# Patient Record
Sex: Male | Born: 1935 | Race: White | Hispanic: No | Marital: Married | Smoking: Former smoker
Health system: Southern US, Community
[De-identification: ages and names within clinical notes are randomized; demographics above are authoritative.]

## PROBLEM LIST (undated history)

## (undated) DIAGNOSIS — R809 Proteinuria, unspecified: Secondary | ICD-10-CM

## (undated) DIAGNOSIS — K5790 Diverticulosis of intestine, part unspecified, without perforation or abscess without bleeding: Secondary | ICD-10-CM

## (undated) DIAGNOSIS — I2510 Atherosclerotic heart disease of native coronary artery without angina pectoris: Secondary | ICD-10-CM

## (undated) DIAGNOSIS — N400 Enlarged prostate without lower urinary tract symptoms: Secondary | ICD-10-CM

## (undated) DIAGNOSIS — E785 Hyperlipidemia, unspecified: Secondary | ICD-10-CM

## (undated) DIAGNOSIS — I10 Essential (primary) hypertension: Secondary | ICD-10-CM

## (undated) DIAGNOSIS — E0590 Thyrotoxicosis, unspecified without thyrotoxic crisis or storm: Secondary | ICD-10-CM

## (undated) DIAGNOSIS — I872 Venous insufficiency (chronic) (peripheral): Secondary | ICD-10-CM

## (undated) DIAGNOSIS — G4700 Insomnia, unspecified: Secondary | ICD-10-CM

## (undated) DIAGNOSIS — D721 Eosinophilia: Secondary | ICD-10-CM

## (undated) DIAGNOSIS — N189 Chronic kidney disease, unspecified: Secondary | ICD-10-CM

## (undated) HISTORY — DX: Thyrotoxicosis, unspecified without thyrotoxic crisis or storm: E05.90

## (undated) HISTORY — DX: Hyperlipidemia, unspecified: E78.5

## (undated) HISTORY — DX: Proteinuria, unspecified: R80.9

## (undated) HISTORY — DX: Atherosclerotic heart disease of native coronary artery without angina pectoris: I25.10

## (undated) HISTORY — DX: Venous insufficiency (chronic) (peripheral): I87.2

## (undated) HISTORY — DX: Benign prostatic hyperplasia without lower urinary tract symptoms: N40.0

## (undated) HISTORY — DX: Essential (primary) hypertension: I10

## (undated) HISTORY — DX: Chronic kidney disease, unspecified: N18.9

## (undated) HISTORY — DX: Insomnia, unspecified: G47.00

## (undated) HISTORY — DX: Eosinophilia: D72.1

## (undated) HISTORY — DX: Diverticulosis of intestine, part unspecified, without perforation or abscess without bleeding: K57.90

## (undated) HISTORY — PX: CORONARY ARTERY BYPASS GRAFT: SHX141

---

## 1998-05-29 ENCOUNTER — Encounter: Admission: RE | Admit: 1998-05-29 | Discharge: 1998-05-29 | Payer: Self-pay | Admitting: Internal Medicine

## 1999-01-04 ENCOUNTER — Encounter: Admission: RE | Admit: 1999-01-04 | Discharge: 1999-01-04 | Payer: Self-pay | Admitting: Internal Medicine

## 1999-07-14 ENCOUNTER — Encounter: Admission: RE | Admit: 1999-07-14 | Discharge: 1999-07-14 | Payer: Self-pay | Admitting: Internal Medicine

## 1999-07-28 ENCOUNTER — Encounter: Admission: RE | Admit: 1999-07-28 | Discharge: 1999-07-28 | Payer: Self-pay | Admitting: Internal Medicine

## 1999-10-05 ENCOUNTER — Encounter: Admission: RE | Admit: 1999-10-05 | Discharge: 1999-10-05 | Payer: Self-pay | Admitting: Internal Medicine

## 2000-02-02 ENCOUNTER — Encounter: Admission: RE | Admit: 2000-02-02 | Discharge: 2000-02-02 | Payer: Self-pay | Admitting: Internal Medicine

## 2000-03-24 ENCOUNTER — Encounter: Admission: RE | Admit: 2000-03-24 | Discharge: 2000-04-14 | Payer: Self-pay | Admitting: Neurology

## 2000-09-05 ENCOUNTER — Encounter: Admission: RE | Admit: 2000-09-05 | Discharge: 2000-09-05 | Payer: Self-pay | Admitting: Internal Medicine

## 2000-09-13 ENCOUNTER — Encounter: Admission: RE | Admit: 2000-09-13 | Discharge: 2000-09-13 | Payer: Self-pay | Admitting: Hematology and Oncology

## 2000-09-28 ENCOUNTER — Encounter: Admission: RE | Admit: 2000-09-28 | Discharge: 2000-09-28 | Payer: Self-pay | Admitting: Internal Medicine

## 2000-10-04 ENCOUNTER — Encounter: Admission: RE | Admit: 2000-10-04 | Discharge: 2000-10-04 | Payer: Self-pay | Admitting: Internal Medicine

## 2001-01-18 ENCOUNTER — Ambulatory Visit (HOSPITAL_COMMUNITY): Admission: RE | Admit: 2001-01-18 | Discharge: 2001-01-18 | Payer: Self-pay | Admitting: *Deleted

## 2001-01-19 ENCOUNTER — Ambulatory Visit (HOSPITAL_COMMUNITY): Admission: RE | Admit: 2001-01-19 | Discharge: 2001-01-19 | Payer: Self-pay | Admitting: *Deleted

## 2001-01-23 ENCOUNTER — Ambulatory Visit (HOSPITAL_COMMUNITY): Admission: RE | Admit: 2001-01-23 | Discharge: 2001-01-23 | Payer: Self-pay | Admitting: *Deleted

## 2001-01-23 ENCOUNTER — Encounter: Payer: Self-pay | Admitting: *Deleted

## 2001-08-28 ENCOUNTER — Encounter: Admission: RE | Admit: 2001-08-28 | Discharge: 2001-08-28 | Payer: Self-pay | Admitting: Internal Medicine

## 2002-03-02 ENCOUNTER — Emergency Department (HOSPITAL_COMMUNITY): Admission: EM | Admit: 2002-03-02 | Discharge: 2002-03-02 | Payer: Self-pay | Admitting: Emergency Medicine

## 2002-05-01 ENCOUNTER — Encounter: Admission: RE | Admit: 2002-05-01 | Discharge: 2002-05-01 | Payer: Self-pay | Admitting: Internal Medicine

## 2002-10-08 ENCOUNTER — Encounter: Admission: RE | Admit: 2002-10-08 | Discharge: 2002-10-08 | Payer: Self-pay | Admitting: Internal Medicine

## 2002-11-06 ENCOUNTER — Encounter: Admission: RE | Admit: 2002-11-06 | Discharge: 2002-11-06 | Payer: Self-pay | Admitting: Internal Medicine

## 2002-11-14 ENCOUNTER — Encounter: Admission: RE | Admit: 2002-11-14 | Discharge: 2002-11-14 | Payer: Self-pay | Admitting: Internal Medicine

## 2002-11-19 ENCOUNTER — Emergency Department (HOSPITAL_COMMUNITY): Admission: EM | Admit: 2002-11-19 | Discharge: 2002-11-19 | Payer: Self-pay | Admitting: Emergency Medicine

## 2002-12-05 ENCOUNTER — Encounter: Admission: RE | Admit: 2002-12-05 | Discharge: 2002-12-05 | Payer: Self-pay | Admitting: Internal Medicine

## 2003-07-15 ENCOUNTER — Ambulatory Visit (HOSPITAL_COMMUNITY): Admission: RE | Admit: 2003-07-15 | Discharge: 2003-07-15 | Payer: Self-pay | Admitting: Internal Medicine

## 2003-07-15 ENCOUNTER — Encounter: Admission: RE | Admit: 2003-07-15 | Discharge: 2003-07-15 | Payer: Self-pay | Admitting: Internal Medicine

## 2003-09-01 ENCOUNTER — Encounter: Payer: Self-pay | Admitting: Internal Medicine

## 2003-09-25 ENCOUNTER — Encounter: Admission: RE | Admit: 2003-09-25 | Discharge: 2003-09-25 | Payer: Self-pay | Admitting: Internal Medicine

## 2003-10-03 ENCOUNTER — Ambulatory Visit (HOSPITAL_COMMUNITY): Admission: RE | Admit: 2003-10-03 | Discharge: 2003-10-03 | Payer: Self-pay | Admitting: Cardiology

## 2003-10-10 ENCOUNTER — Emergency Department (HOSPITAL_COMMUNITY): Admission: EM | Admit: 2003-10-10 | Discharge: 2003-10-10 | Payer: Self-pay | Admitting: Emergency Medicine

## 2003-11-29 DIAGNOSIS — D721 Eosinophilia, unspecified: Secondary | ICD-10-CM

## 2003-11-29 DIAGNOSIS — K5790 Diverticulosis of intestine, part unspecified, without perforation or abscess without bleeding: Secondary | ICD-10-CM

## 2003-11-29 HISTORY — DX: Diverticulosis of intestine, part unspecified, without perforation or abscess without bleeding: K57.90

## 2003-11-29 HISTORY — DX: Eosinophilia, unspecified: D72.10

## 2004-01-27 ENCOUNTER — Encounter: Admission: RE | Admit: 2004-01-27 | Discharge: 2004-01-27 | Payer: Self-pay | Admitting: Internal Medicine

## 2004-02-05 ENCOUNTER — Encounter: Admission: RE | Admit: 2004-02-05 | Discharge: 2004-02-05 | Payer: Self-pay | Admitting: Internal Medicine

## 2004-03-10 ENCOUNTER — Emergency Department (HOSPITAL_COMMUNITY): Admission: EM | Admit: 2004-03-10 | Discharge: 2004-03-10 | Payer: Self-pay | Admitting: Emergency Medicine

## 2004-03-11 ENCOUNTER — Inpatient Hospital Stay (HOSPITAL_COMMUNITY): Admission: EM | Admit: 2004-03-11 | Discharge: 2004-03-15 | Payer: Self-pay | Admitting: Emergency Medicine

## 2004-03-24 ENCOUNTER — Encounter: Admission: RE | Admit: 2004-03-24 | Discharge: 2004-03-24 | Payer: Self-pay | Admitting: Internal Medicine

## 2004-04-22 ENCOUNTER — Encounter: Admission: RE | Admit: 2004-04-22 | Discharge: 2004-04-22 | Payer: Self-pay | Admitting: Internal Medicine

## 2004-05-12 ENCOUNTER — Ambulatory Visit (HOSPITAL_COMMUNITY): Admission: RE | Admit: 2004-05-12 | Discharge: 2004-05-12 | Payer: Self-pay | Admitting: Internal Medicine

## 2004-05-12 ENCOUNTER — Encounter: Admission: RE | Admit: 2004-05-12 | Discharge: 2004-05-12 | Payer: Self-pay | Admitting: Internal Medicine

## 2004-06-24 ENCOUNTER — Encounter: Admission: RE | Admit: 2004-06-24 | Discharge: 2004-06-24 | Payer: Self-pay | Admitting: Internal Medicine

## 2004-10-11 ENCOUNTER — Ambulatory Visit: Payer: Self-pay | Admitting: Internal Medicine

## 2004-12-22 ENCOUNTER — Ambulatory Visit: Payer: Self-pay | Admitting: Cardiology

## 2005-01-04 ENCOUNTER — Ambulatory Visit: Payer: Self-pay | Admitting: Cardiology

## 2005-01-04 ENCOUNTER — Ambulatory Visit: Payer: Self-pay

## 2005-03-07 ENCOUNTER — Ambulatory Visit: Payer: Self-pay | Admitting: Internal Medicine

## 2005-07-11 ENCOUNTER — Ambulatory Visit: Payer: Self-pay | Admitting: Internal Medicine

## 2005-09-26 ENCOUNTER — Ambulatory Visit: Payer: Self-pay | Admitting: Internal Medicine

## 2005-10-25 ENCOUNTER — Ambulatory Visit: Payer: Self-pay | Admitting: Internal Medicine

## 2005-11-01 ENCOUNTER — Ambulatory Visit: Payer: Self-pay | Admitting: Cardiology

## 2005-11-03 ENCOUNTER — Ambulatory Visit (HOSPITAL_COMMUNITY): Admission: RE | Admit: 2005-11-03 | Discharge: 2005-11-03 | Payer: Self-pay | Admitting: Internal Medicine

## 2005-11-09 ENCOUNTER — Ambulatory Visit (HOSPITAL_COMMUNITY): Admission: RE | Admit: 2005-11-09 | Discharge: 2005-11-09 | Payer: Self-pay | Admitting: Gastroenterology

## 2005-11-15 ENCOUNTER — Ambulatory Visit: Payer: Self-pay | Admitting: Cardiology

## 2005-11-28 DIAGNOSIS — R809 Proteinuria, unspecified: Secondary | ICD-10-CM

## 2005-11-28 HISTORY — DX: Proteinuria, unspecified: R80.9

## 2005-12-07 ENCOUNTER — Ambulatory Visit (HOSPITAL_COMMUNITY): Admission: RE | Admit: 2005-12-07 | Discharge: 2005-12-07 | Payer: Self-pay | Admitting: Gastroenterology

## 2005-12-20 ENCOUNTER — Ambulatory Visit: Payer: Self-pay

## 2006-01-11 ENCOUNTER — Ambulatory Visit (HOSPITAL_COMMUNITY): Admission: RE | Admit: 2006-01-11 | Discharge: 2006-01-11 | Payer: Self-pay | Admitting: Internal Medicine

## 2006-01-11 ENCOUNTER — Ambulatory Visit: Payer: Self-pay | Admitting: Internal Medicine

## 2006-04-03 ENCOUNTER — Ambulatory Visit: Payer: Self-pay | Admitting: Internal Medicine

## 2006-07-26 ENCOUNTER — Ambulatory Visit: Payer: Self-pay | Admitting: Internal Medicine

## 2006-08-28 ENCOUNTER — Ambulatory Visit: Payer: Self-pay | Admitting: Cardiology

## 2006-09-30 DIAGNOSIS — Z951 Presence of aortocoronary bypass graft: Secondary | ICD-10-CM

## 2006-09-30 DIAGNOSIS — R809 Proteinuria, unspecified: Secondary | ICD-10-CM

## 2006-09-30 DIAGNOSIS — I2510 Atherosclerotic heart disease of native coronary artery without angina pectoris: Secondary | ICD-10-CM | POA: Insufficient documentation

## 2006-09-30 DIAGNOSIS — K5730 Diverticulosis of large intestine without perforation or abscess without bleeding: Secondary | ICD-10-CM

## 2006-09-30 DIAGNOSIS — E113299 Type 2 diabetes mellitus with mild nonproliferative diabetic retinopathy without macular edema, unspecified eye: Secondary | ICD-10-CM

## 2006-09-30 DIAGNOSIS — I10 Essential (primary) hypertension: Secondary | ICD-10-CM | POA: Insufficient documentation

## 2006-09-30 DIAGNOSIS — Z8639 Personal history of other endocrine, nutritional and metabolic disease: Secondary | ICD-10-CM

## 2006-09-30 DIAGNOSIS — K8020 Calculus of gallbladder without cholecystitis without obstruction: Secondary | ICD-10-CM | POA: Insufficient documentation

## 2006-09-30 DIAGNOSIS — N184 Chronic kidney disease, stage 4 (severe): Secondary | ICD-10-CM

## 2006-09-30 DIAGNOSIS — D721 Eosinophilia: Secondary | ICD-10-CM

## 2006-11-13 DIAGNOSIS — N400 Enlarged prostate without lower urinary tract symptoms: Secondary | ICD-10-CM

## 2006-11-15 ENCOUNTER — Ambulatory Visit: Payer: Self-pay | Admitting: Internal Medicine

## 2006-11-15 LAB — CONVERTED CEMR LAB
Basophils Absolute: 0 10*3/uL (ref 0.0–0.1)
Basophils Relative: 1 % (ref 0–1)
HCT: 47 % (ref 41.0–49.0)
Hemoglobin: 15.3 g/dL (ref 13.9–16.8)
Lymphocytes Relative: 34 % (ref 15–43)
Lymphs Abs: 2.4 10*3/uL (ref 0.8–3.1)
MCV: 91.3 fL (ref 78.8–100.0)
Monocytes Absolute: 0.5 10*3/uL (ref 0.2–0.7)
Monocytes Relative: 7 % (ref 3–11)
Potassium: 4.2 meq/L (ref 3.5–5.3)
RBC: 5.15 M/uL (ref 4.20–5.50)
Sodium: 142 meq/L (ref 135–145)

## 2007-01-10 ENCOUNTER — Encounter: Payer: Self-pay | Admitting: Internal Medicine

## 2007-03-13 ENCOUNTER — Ambulatory Visit: Payer: Self-pay | Admitting: Internal Medicine

## 2007-03-26 ENCOUNTER — Ambulatory Visit: Payer: Self-pay | Admitting: Hospitalist

## 2007-07-25 ENCOUNTER — Ambulatory Visit: Payer: Self-pay | Admitting: Internal Medicine

## 2007-07-25 LAB — CONVERTED CEMR LAB: Blood Glucose, Fingerstick: 204

## 2007-08-31 ENCOUNTER — Ambulatory Visit: Payer: Self-pay | Admitting: Cardiology

## 2007-09-03 ENCOUNTER — Ambulatory Visit: Payer: Self-pay | Admitting: *Deleted

## 2007-09-03 ENCOUNTER — Ambulatory Visit: Payer: Self-pay | Admitting: Internal Medicine

## 2007-09-03 LAB — CONVERTED CEMR LAB

## 2007-09-04 LAB — CONVERTED CEMR LAB
ALT: 11 units/L (ref 0–53)
AST: 15 units/L (ref 0–37)
Alkaline Phosphatase: 90 units/L (ref 39–117)
BUN: 39 mg/dL — ABNORMAL HIGH (ref 6–23)
Calcium: 8.7 mg/dL (ref 8.4–10.5)
Chloride: 106 meq/L (ref 96–112)
Creatinine, Urine: 110.9 mg/dL
Microalb Creat Ratio: 489.7 mg/g — ABNORMAL HIGH (ref 0.0–30.0)
Microalb, Ur: 54.3 mg/dL — ABNORMAL HIGH (ref 0.00–1.89)
Sodium: 141 meq/L (ref 135–145)
Total CHOL/HDL Ratio: 2.8
Total Protein: 6.7 g/dL (ref 6.0–8.3)

## 2007-09-18 ENCOUNTER — Encounter: Payer: Self-pay | Admitting: Internal Medicine

## 2007-09-24 ENCOUNTER — Ambulatory Visit: Payer: Self-pay | Admitting: Infectious Diseases

## 2007-12-14 ENCOUNTER — Ambulatory Visit: Payer: Self-pay | Admitting: Internal Medicine

## 2007-12-14 LAB — CONVERTED CEMR LAB: Hgb A1c MFr Bld: 8.1 %

## 2008-02-18 ENCOUNTER — Ambulatory Visit: Payer: Self-pay | Admitting: Internal Medicine

## 2008-02-18 DIAGNOSIS — I872 Venous insufficiency (chronic) (peripheral): Secondary | ICD-10-CM

## 2008-02-18 LAB — CONVERTED CEMR LAB
AST: 17 units/L (ref 0–37)
Alkaline Phosphatase: 77 units/L (ref 39–117)
Basophils Relative: 1 % (ref 0–1)
CO2: 22 meq/L (ref 19–32)
Calcium: 8.9 mg/dL (ref 8.4–10.5)
Chloride: 109 meq/L (ref 96–112)
Creatinine, Ser: 1.51 mg/dL — ABNORMAL HIGH (ref 0.40–1.50)
Creatinine, Urine: 72.8 mg/dL
Glucose, Bld: 165 mg/dL — ABNORMAL HIGH (ref 70–99)
Ketones, ur: NEGATIVE mg/dL
Leukocytes, UA: NEGATIVE
Lymphocytes Relative: 28 % (ref 12–46)
MCV: 86.4 fL (ref 78.0–100.0)
Microalb, Ur: 256 mg/dL — ABNORMAL HIGH (ref 0.00–1.89)
Monocytes Absolute: 0.5 10*3/uL (ref 0.1–1.0)
Monocytes Relative: 8 % (ref 3–12)
Platelets: 212 10*3/uL (ref 150–400)
Protein, ur: >300 mg/dL — AB
RBC: 5.3 M/uL (ref 4.22–5.81)
TSH: 0.84 microintl units/mL (ref 0.350–5.50)
Urine Glucose: 100 mg/dL — AB
WBC: 6.7 10*3/uL (ref 4.0–10.5)
pH: 6.5 (ref 5.0–8.0)

## 2008-04-04 ENCOUNTER — Ambulatory Visit: Payer: Self-pay | Admitting: Internal Medicine

## 2008-04-04 LAB — CONVERTED CEMR LAB
BUN: 33 mg/dL — ABNORMAL HIGH (ref 6–23)
Calcium: 9.1 mg/dL (ref 8.4–10.5)
Glucose, Bld: 114 mg/dL — ABNORMAL HIGH (ref 70–99)
Potassium: 4.3 meq/L (ref 3.5–5.3)
Sodium: 142 meq/L (ref 135–145)

## 2008-04-23 ENCOUNTER — Telehealth (INDEPENDENT_AMBULATORY_CARE_PROVIDER_SITE_OTHER): Payer: Self-pay | Admitting: Pharmacy Technician

## 2008-05-16 ENCOUNTER — Ambulatory Visit: Payer: Self-pay | Admitting: Internal Medicine

## 2008-05-16 DIAGNOSIS — G4700 Insomnia, unspecified: Secondary | ICD-10-CM | POA: Insufficient documentation

## 2008-08-08 ENCOUNTER — Ambulatory Visit: Payer: Self-pay | Admitting: Internal Medicine

## 2008-08-08 LAB — CONVERTED CEMR LAB: Hgb A1c MFr Bld: 7.4 %

## 2008-08-12 ENCOUNTER — Ambulatory Visit: Payer: Self-pay | Admitting: Internal Medicine

## 2008-08-12 LAB — CONVERTED CEMR LAB
BUN: 37 mg/dL — ABNORMAL HIGH (ref 6–23)
CO2: 23 meq/L (ref 19–32)
Calcium: 8.9 mg/dL (ref 8.4–10.5)
Chloride: 105 meq/L (ref 96–112)
Cholesterol: 151 mg/dL (ref 0–200)
Creatinine, Ser: 1.74 mg/dL — ABNORMAL HIGH (ref 0.40–1.50)
Triglycerides: 181 mg/dL — ABNORMAL HIGH (ref <150)

## 2008-09-24 ENCOUNTER — Ambulatory Visit: Payer: Self-pay | Admitting: Cardiology

## 2008-09-24 LAB — CONVERTED CEMR LAB
Basophils Absolute: 0.2 10*3/uL — ABNORMAL HIGH (ref 0.0–0.1)
CO2: 28 meq/L (ref 19–32)
Chloride: 107 meq/L (ref 96–112)
GFR calc Af Amer: 54 mL/min
Glucose, Bld: 127 mg/dL — ABNORMAL HIGH (ref 70–99)
Lymphocytes Relative: 35.5 % (ref 12.0–46.0)
MCHC: 34.3 g/dL (ref 30.0–36.0)
MCV: 88.4 fL (ref 78.0–100.0)
Monocytes Absolute: 0.5 10*3/uL (ref 0.1–1.0)
Neutrophils Relative %: 38.7 % — ABNORMAL LOW (ref 43.0–77.0)
Platelets: 186 10*3/uL (ref 150–400)
Potassium: 5.2 meq/L — ABNORMAL HIGH (ref 3.5–5.1)
RBC: 4.67 M/uL (ref 4.22–5.81)

## 2008-10-30 ENCOUNTER — Encounter: Payer: Self-pay | Admitting: Internal Medicine

## 2009-01-20 ENCOUNTER — Ambulatory Visit: Payer: Self-pay | Admitting: Internal Medicine

## 2009-01-20 LAB — CONVERTED CEMR LAB
AST: 18 units/L (ref 0–37)
Alkaline Phosphatase: 82 units/L (ref 39–117)
CO2: 24 meq/L (ref 19–32)
Calcium: 9.2 mg/dL (ref 8.4–10.5)
Chloride: 104 meq/L (ref 96–112)
Glucose, Bld: 161 mg/dL — ABNORMAL HIGH (ref 70–99)
Potassium: 4.7 meq/L (ref 3.5–5.3)
Sodium: 142 meq/L (ref 135–145)
Total Bilirubin: 0.8 mg/dL (ref 0.3–1.2)

## 2009-05-08 ENCOUNTER — Ambulatory Visit: Payer: Self-pay | Admitting: Internal Medicine

## 2009-05-08 LAB — CONVERTED CEMR LAB
ALT: 10 units/L (ref 0–53)
AST: 17 units/L (ref 0–37)
Albumin: 3.6 g/dL (ref 3.5–5.2)
Blood Glucose, AC Bkfst: 165 mg/dL
Chloride: 107 meq/L (ref 96–112)
Free T4: 0.94 ng/dL (ref 0.80–1.80)
GFR calc Af Amer: 48 mL/min — ABNORMAL LOW (ref >60)
GFR calc non Af Amer: 40 mL/min — ABNORMAL LOW (ref >60)
Hgb A1c MFr Bld: 7.7 %
Potassium: 4.4 meq/L (ref 3.5–5.3)
Sodium: 141 meq/L (ref 135–145)
Total Protein: 6.7 g/dL (ref 6.0–8.3)

## 2009-09-22 ENCOUNTER — Ambulatory Visit: Payer: Self-pay | Admitting: Internal Medicine

## 2009-09-24 ENCOUNTER — Ambulatory Visit: Payer: Self-pay | Admitting: Cardiology

## 2010-01-07 ENCOUNTER — Encounter: Payer: Self-pay | Admitting: Internal Medicine

## 2010-01-12 ENCOUNTER — Telehealth: Payer: Self-pay | Admitting: Internal Medicine

## 2010-01-19 ENCOUNTER — Ambulatory Visit: Payer: Self-pay | Admitting: Internal Medicine

## 2010-01-19 LAB — CONVERTED CEMR LAB: Blood Glucose, Fingerstick: 166

## 2010-01-20 LAB — CONVERTED CEMR LAB
Chloride: 107 meq/L (ref 96–112)
Creatinine, Urine: 139.8 mg/dL
Microalb, Ur: 425.6 mg/dL — ABNORMAL HIGH (ref 0.00–1.89)
Potassium: 4.5 meq/L (ref 3.5–5.3)

## 2010-06-04 ENCOUNTER — Ambulatory Visit: Payer: Self-pay | Admitting: Internal Medicine

## 2010-06-04 LAB — CONVERTED CEMR LAB
Blood Glucose, AC Bkfst: 178 mg/dL
CO2: 20 meq/L (ref 19–32)
Calcium: 8.5 mg/dL (ref 8.4–10.5)
Chloride: 107 meq/L (ref 96–112)
Creatinine, Ser: 1.78 mg/dL — ABNORMAL HIGH (ref 0.40–1.50)
Glucose, Bld: 181 mg/dL — ABNORMAL HIGH (ref 70–99)
HCT: 45.7 % (ref 39.0–52.0)
MCV: 92 fL (ref >=78.0)
Platelets: 171 10*3/uL (ref 150–400)
Potassium: 3.9 meq/L (ref 3.5–5.3)
RBC: 4.97 M/uL (ref 4.22–5.81)
Sodium: 137 meq/L (ref 135–145)
WBC: 6.3 10*3/uL (ref 4.0–10.5)

## 2010-06-22 ENCOUNTER — Telehealth: Payer: Self-pay | Admitting: *Deleted

## 2010-06-25 ENCOUNTER — Ambulatory Visit: Payer: Self-pay | Admitting: Internal Medicine

## 2010-06-25 DIAGNOSIS — M79609 Pain in unspecified limb: Secondary | ICD-10-CM | POA: Insufficient documentation

## 2010-09-21 ENCOUNTER — Encounter: Payer: Self-pay | Admitting: Internal Medicine

## 2010-09-23 ENCOUNTER — Ambulatory Visit: Payer: Self-pay | Admitting: Cardiology

## 2010-09-23 ENCOUNTER — Encounter: Payer: Self-pay | Admitting: Cardiology

## 2010-09-23 DIAGNOSIS — E785 Hyperlipidemia, unspecified: Secondary | ICD-10-CM

## 2010-09-24 ENCOUNTER — Telehealth: Payer: Self-pay | Admitting: Cardiology

## 2010-09-24 LAB — CONVERTED CEMR LAB
Albumin: 2.9 g/dL — ABNORMAL LOW (ref 3.5–5.2)
Alkaline Phosphatase: 76 units/L (ref 39–117)
BUN: 24 mg/dL — ABNORMAL HIGH (ref 6–23)
Basophils Relative: 0.5 % (ref 0.0–3.0)
Bilirubin, Direct: 0.1 mg/dL (ref 0.0–0.3)
Chloride: 103 meq/L (ref 96–112)
Eosinophils Relative: 11.1 % — ABNORMAL HIGH (ref 0.0–5.0)
Glucose, Bld: 168 mg/dL — ABNORMAL HIGH (ref 70–99)
HCT: 42.5 % (ref 39.0–52.0)
Lymphocytes Relative: 25.6 % (ref 12.0–46.0)
Lymphs Abs: 2.1 10*3/uL (ref 0.7–4.0)
MCV: 92.2 fL (ref 78.0–100.0)
Monocytes Absolute: 0.5 10*3/uL (ref 0.1–1.0)
Monocytes Relative: 6.4 % (ref 3.0–12.0)
Neutro Abs: 4.6 10*3/uL (ref 1.4–7.7)
Platelets: 200 10*3/uL (ref 150.0–400.0)
Potassium: 3.8 meq/L (ref 3.5–5.1)
RDW: 13.8 % (ref 11.5–14.6)
Sodium: 135 meq/L (ref 135–145)
Total Protein: 5.8 g/dL — ABNORMAL LOW (ref 6.0–8.3)
Triglycerides: 101 mg/dL (ref 0.0–149.0)
VLDL: 20.2 mg/dL (ref 0.0–40.0)
WBC: 8.2 10*3/uL (ref 4.5–10.5)

## 2010-10-26 ENCOUNTER — Encounter: Payer: Self-pay | Admitting: Internal Medicine

## 2010-12-21 ENCOUNTER — Ambulatory Visit
Admission: RE | Admit: 2010-12-21 | Discharge: 2010-12-21 | Payer: Self-pay | Source: Home / Self Care | Attending: Internal Medicine | Admitting: Internal Medicine

## 2010-12-21 DIAGNOSIS — R269 Unspecified abnormalities of gait and mobility: Secondary | ICD-10-CM | POA: Insufficient documentation

## 2010-12-21 LAB — CONVERTED CEMR LAB
Blood Glucose, Fingerstick: 183
Hgb A1c MFr Bld: 8 %

## 2010-12-22 LAB — GLUCOSE, CAPILLARY: Glucose-Capillary: 183 mg/dL — ABNORMAL HIGH (ref 70–99)

## 2010-12-28 NOTE — Progress Notes (Signed)
Summary: foot pain//kg  Phone Note Call from Patient   Caller: Patient Summary of Call: Received call from patient c/o left foot pain.  States he was told that he could get orthopedic referral if problem persists. Will put order in for orthopedic referral and forward message to pcp for review.Cynda Familia [facility])  June 23, 2010 3:55 PM   Follow-up for Phone Call        Will make pt an appt for Fri July 29th with Dr Aundria Rud to eval foot pain.Cynda Familia [facility])  June 24, 2010 4:27 PM  Follow-up by: Cynda Familia [facility]),  June 23, 2010 4:34 PM

## 2010-12-28 NOTE — Assessment & Plan Note (Signed)
Summary: overbook ok-left foot pain//kg   Vital Signs:  Patient profile:   77 year old male Height:      70 inches (177.80 cm) Weight:      229.3 pounds (104.23 kg) BMI:     33.02 Temp:     97.1 degrees F (36.17 degrees C) oral Pulse rate:   62 / minute BP sitting:   151 / 82  (left arm) Cuff size:   large  Vitals Entered By: Cynda Familia Duncan Dull) (June 25, 2010 9:31 AM) CC: left foot pain Is Patient Diabetic? Yes Did you bring your meter with you today? No Research Study Name: Orland Penman address left foot only, no cbg done today//kg Pain Assessment Patient in pain? yes     Location: left foot  Intensity: 5 Type: unable to describe Nutritional Status BMI of > 30 = obese  Does patient need assistance? Functional Status Self care Ambulation Impaired:Risk for fall   Primary Care Provider:  Dr Aundria Rud  CC:  left foot pain.  History of Present Illness: 77 man with DM and nephrotic proteinuria and massive LE edema.  Acute visit for subacute lower left leg pain, mostly with wgt-bearing.  No pain in bed or sitting.  Preventive Screening-Counseling & Management  Alcohol-Tobacco     Alcohol type: once a month     Smoking Status: quit     Year Quit: years ago  Allergies: No Known Drug Allergies  Physical Exam  Extremities:  massive symmetric edema below knees. erythema but no ulcers  No lesions of planta or toes. hair on dorsum of toes; feet warm sense to light touch nl bilat. no tenderness to compression of toes, foot or ankle. FROM w/o pain.   Impression & Recommendations:  Problem # 1:  LEG PAIN (ICD-729.5) exact cause unclear.  may be related to massive edema. arterial circ seems OK. he wears very poor shoes (canvas shoes with heel flattened). May benefit diagnostically from podiatry visit.  Problem # 2:  EDEMA (ICD-782.3) permanent conundrum because he finds polyuria very troublesome.  Under-adheres to lasix.  His updated medication list for this  problem includes:    Furosemide 40 Mg Tabs (Furosemide) .Marland Kitchen... Take 1 tablet by mouth once a day  Complete Medication List: 1)  Metoprolol Tartrate 25 Mg Tabs (Metoprolol tartrate) .... Take one tablet by mouth twice a day 2)  Zocor 20 Mg Tabs (Simvastatin) .... Take one tablet by mouth once daily 3)  Glipizide 10 Mg Tabs (Glipizide) .... Take 1 tablet by mouth two times a day 4)  Flomax 0.4 Mg Cp24 (Tamsulosin hcl) .... Take as directed once daily 5)  Aspirin 81 Mg Tabs (Aspirin) .... Take one tablet by mouth once daily 6)  Omeprazole 20 Mg Cpdr (Omeprazole) .... Take 1 every other day 7)  Ultracet 37.5-325 Mg Tabs (Tramadol-acetaminophen) .... Take 1 tablet by mouth three times a day as needed pain 8)  Nitroglycerin 0.4 Mg Subl (Nitroglycerin) .... Use as directed 9)  Amlodipine Besylate 5 Mg Tabs (Amlodipine besylate) .... Take 1 tablet by mouth once a day 10)  Benazepril Hcl 40 Mg Tabs (Benazepril hcl) .... Take 1 tablet by mouth two times a day 11)  Ambien 5 Mg Tabs (Zolpidem tartrate) .Marland Kitchen.. 1 tab at bedtime 12)  Furosemide 40 Mg Tabs (Furosemide) .... Take 1 tablet by mouth once a day  Other Orders: Podiatry Referral (Podiatry)  Patient Instructions: 1)  Please schedule a follow-up appointment in 1 month.   Prevention & Chronic Care  Immunizations   Influenza vaccine: Fluvax 3+  (09/22/2009)    Tetanus booster: Not documented    Pneumococcal vaccine: Not documented    H. zoster vaccine: Not documented  Colorectal Screening   Hemoccult: Not documented   Hemoccult action/deferral: Not indicated  (09/22/2009)    Colonoscopy: Not documented   Colonoscopy action/deferral: Not indicated  (09/22/2009)  Other Screening   PSA: Not documented   PSA action/deferral: Not indicated  (09/22/2009)   Smoking status: quit  (06/25/2010)  Diabetes Mellitus   HgbA1C: 7.9  (06/04/2010)    Eye exam: No diabetic retinopathy.    No macular edema.   (10/30/2008)   Eye exam due:  10/2009    Foot exam: yes  (01/19/2010)   Foot exam action/deferral: Do today   High risk foot: Yes  (01/19/2010)   Foot care education: Done  (01/19/2010)   Foot exam due: 07/27/2007    Urine microalbumin/creatinine ratio: 3044.3  (01/19/2010)  Lipids   Total Cholesterol: 151  (08/08/2008)   LDL: 59  (08/08/2008)   LDL Direct: Not documented   HDL: 56  (08/08/2008)   Triglycerides: 181  (08/08/2008)  Hypertension   Last Blood Pressure: 151 / 82  (06/25/2010)   Serum creatinine: 1.78  (06/04/2010)   Serum potassium 3.9  (06/04/2010)  Self-Management Support :   Personal Goals (by the next clinic visit) :     Personal A1C goal: 7  (01/19/2010)     Personal blood pressure goal: 130/80  (01/19/2010)     Personal LDL goal: 100  (01/19/2010)    Diabetes self-management support: Written self-care plan  (06/25/2010)   Diabetes care plan printed   Last diabetes self-management training by diabetes educator: 09/03/2007   Last medical nutrition therapy: 09/03/2007    Hypertension self-management support: Written self-care plan  (06/25/2010)   Hypertension self-care plan printed.

## 2010-12-28 NOTE — Consult Note (Signed)
Summary: [facility]   Imported By: Louretta Parma 10/05/2010 14:29:03  _____________________________________________________________________  External Attachment:    Type:   Image     Comment:   External Document  Appended Document: [facility]   Diabetic Eye Exam  Procedure date:  09/21/2010  Findings:      No diabetic retinopathy.   Glaucoma suspect   Procedures Next Due Date:    Diabetic Eye Exam: 09/2011   Diabetic Eye Exam  Procedure date:  09/21/2010  Findings:      No diabetic retinopathy.   Glaucoma suspect   Procedures Next Due Date:    Diabetic Eye Exam: 09/2011

## 2010-12-28 NOTE — Letter (Signed)
Summary: Ophthalmology: Dr. Elmer Picker   Ophthalmology: Dr. Elmer Picker   Imported By: Florinda Marker 01/12/2010 14:47:48  _____________________________________________________________________  External Attachment:    Type:   Image     Comment:   External Document

## 2010-12-28 NOTE — Progress Notes (Signed)
  Phone Note Other Incoming   Summary of Call: Caution from [facility] about dose of benazepril in elderly man.  He has been on 80/day for 2years.  Creatinine and K have been stable.  BP is still borderline.  Dr. Juanda Chance has looked at this carefully and agrees that this is the best regimen at present. Initial call taken by: Ulyess Mort MD,  January 12, 2010 2:46 PM

## 2010-12-28 NOTE — Assessment & Plan Note (Signed)
Summary: CHECKUP/SB.   Vital Signs:  Patient profile:   77 year old male Height:      70 inches (177.80 cm) Weight:      225.5 pounds (102.50 kg) BMI:     32.47 Temp:     97.0 degrees F (36.11 degrees C) oral Pulse rate:   55 / minute BP sitting:   153 / 79  (right arm) Cuff size:   large  Vitals Entered By: Krystal Eaton Duncan Dull) (January 19, 2010 3:15 PM) CC: med refill Is Patient Diabetic? Yes Did you bring your meter with you today? No Pain Assessment Patient in pain? no      Nutritional Status BMI of > 30 = obese CBG Result 166  Have you ever been in a relationship where you felt threatened, hurt or afraid?No   Does patient need assistance? Functional Status Self care Ambulation Normal   Diabetic Foot Exam Last Podiatry Exam Date: 04/04/2008 Foot Inspection  Diabetic Foot Care Education Patient educated on appropriate care of diabetic feet.   High Risk Feet? Yes   10-g (5.07) Semmes-Weinstein Monofilament Test Performed by: Ulyess Mort MD          Right Foot          Left Foot Site 1         abnormal         abnormal Site 4         normal         abnormal Site 5         abnormal         abnormal Site 6         normal         abnormal  Impression                abnormal   CC:  med refill.  Preventive Screening-Counseling & Management  Alcohol-Tobacco     Alcohol type: once a month     Smoking Status: quit     Year Quit: years ago  Allergies: No Known Drug Allergies  Diabetes Management Exam:    Foot Exam (with socks and/or shoes not present):       Sensory-Monofilament:          Left foot: abnormal   Impression & Recommendations:  Problem # 1:  HYPERTENSION (ICD-401.9) See Dr. Regino Schultze note from November 2010 visit.  There are problems with all options for strengthening BP regimen. No chest pain or orthopnea. Somehow he has lost 8 lbs w/o lasix and w/o obvious change in lifestyle.   The following medications were removed from the  medication list:    Furosemide 40 Mg Tabs (Furosemide) .Marland Kitchen... Take two tab in the am and 1one tab in pm. His updated medication list for this problem includes:    Metoprolol Tartrate 25 Mg Tabs (Metoprolol tartrate) .Marland Kitchen... Take one tablet by mouth twice a day    Amlodipine Besylate 5 Mg Tabs (Amlodipine besylate) .Marland Kitchen... Take 1 tablet by mouth once a day    Benazepril Hcl 40 Mg Tabs (Benazepril hcl) .Marland Kitchen... Take 1 tablet by mouth two times a day    Furosemide 40 Mg Tabs (Furosemide) .Marland Kitchen... Take 1 tablet by mouth once a day  Orders: T-Basic Metabolic Panel [id number])  Problem # 2:  DIABETES MELLITUS, TYPE II (ICD-250.00) A1c = 7.8.  Unchanged.  Has nephrotic proteinuria and edema.  Minimal sensation in feet but no lesions.  Loose footwear. Advised daily foot inspection.  Has seen eye doc who advised cataract extractions that he does not want.  Insists his vision is adequate for his needs.   His updated medication list for this problem includes:    Glipizide 10 Mg Tabs (Glipizide) .Marland Kitchen... Take 1 tablet by mouth two times a day    Aspirin 81 Mg Tabs (Aspirin) .Marland Kitchen... Take one tablet by mouth once daily    Benazepril Hcl 40 Mg Tabs (Benazepril hcl) .Marland Kitchen... Take 1 tablet by mouth two times a day  Orders: T-Hgb A1C (in-house) ([id number]) T- Capillary Blood Glucose ([id number]) T-Urine Microalbumin w/creat. ratio [phone])  Complete Medication List: 1)  Metoprolol Tartrate 25 Mg Tabs (Metoprolol tartrate) .... Take one tablet by mouth twice a day 2)  Zocor 20 Mg Tabs (Simvastatin) .... Take one tablet by mouth once daily 3)  Glipizide 10 Mg Tabs (Glipizide) .... Take 1 tablet by mouth two times a day 4)  Flomax 0.4 Mg Cp24 (Tamsulosin hcl) .... Take as directed once daily 5)  Aspirin 81 Mg Tabs (Aspirin) .... Take one tablet by mouth once daily 6)  Omeprazole 20 Mg Cpdr (Omeprazole) .... Take 1 every other day 7)  Ultracet 37.5-325 Mg Tabs (Tramadol-acetaminophen) .... Take 1 tablet by mouth three  times a day as needed pain 8)  Nitroglycerin 0.4 Mg Subl (Nitroglycerin) .... Use as directed 9)  Amlodipine Besylate 5 Mg Tabs (Amlodipine besylate) .... Take 1 tablet by mouth once a day 10)  Benazepril Hcl 40 Mg Tabs (Benazepril hcl) .... Take 1 tablet by mouth two times a day 11)  Ambien 5 Mg Tabs (Zolpidem tartrate) .Marland Kitchen.. 1 tab at bedtime 12)  Furosemide 40 Mg Tabs (Furosemide) .... Take 1 tablet by mouth once a day  Patient Instructions: 1)  Please schedule a follow-up appointment in 4 months. Prescriptions: FUROSEMIDE 40 MG TABS (FUROSEMIDE) Take 1 tablet by mouth once a day  #100 x 3   Entered and Authorized by:   Ulyess Mort MD   Signed by:   Ulyess Mort MD on 01/19/2010   Method used:   Print then Give to Patient   RxID:   [id number] FUROSEMIDE 40 MG TABS (FUROSEMIDE) Take 1 tablet by mouth once a day  #0 x 0   Entered and Authorized by:   Ulyess Mort MD   Signed by:   Ulyess Mort MD on 01/19/2010   Method used:   Print then Give to Patient   RxID:   [id number] BENAZEPRIL HCL 40 MG TABS (BENAZEPRIL HCL) Take 1 tablet by mouth two times a day  #200 x 3   Entered and Authorized by:   Ulyess Mort MD   Signed by:   Ulyess Mort MD on 01/19/2010   Method used:   Print then Give to Patient   RxID:   [id number] AMLODIPINE BESYLATE 5 MG  TABS (AMLODIPINE BESYLATE) Take 1 tablet by mouth once a day  #100 x 3   Entered and Authorized by:   Ulyess Mort MD   Signed by:   Ulyess Mort MD on 01/19/2010   Method used:   Print then Give to Patient   RxID:   [id number] GYIRSWNI 37.5-325 MG  TABS (TRAMADOL-ACETAMINOPHEN) Take 1 tablet by mouth three times a day as needed pain  #100 x 3   Entered and Authorized by:   Ulyess Mort MD   Signed by:   Ulyess Mort MD on 01/19/2010   Method used:   Print then Give to Patient   RxID:  [id number] GLIPIZIDE 10 MG TABS (GLIPIZIDE) Take 1 tablet by mouth two times a day  #200 x 3   Entered and  Authorized by:   Ulyess Mort MD   Signed by:   Ulyess Mort MD on 01/19/2010   Method used:   Print then Give to Patient   RxID:   [id number] ZOCOR 20 MG TABS (SIMVASTATIN) take one tablet by mouth once daily  #100 x 3   Entered and Authorized by:   Ulyess Mort MD   Signed by:   Ulyess Mort MD on 01/19/2010   Method used:   Print then Give to Patient   RxID:   [id number]  Process Orders Check Orders Results:     [facility]: Check successful Tests Sent for requisitioning (January 19, 2010 4:43 PM):     01/19/2010: [facility] -- T-Basic Metabolic Panel [phone] (signed)     01/19/2010: [facility] -- T-Urine Microalbumin w/creat. ratio [[id number]] (signed)    Prevention & Chronic Care Immunizations   Influenza vaccine: Fluvax 3+  (09/22/2009)    Tetanus booster: Not documented    Pneumococcal vaccine: Not documented    H. zoster vaccine: Not documented  Colorectal Screening   Hemoccult: Not documented   Hemoccult action/deferral: Not indicated  (09/22/2009)    Colonoscopy: Not documented   Colonoscopy action/deferral: Not indicated  (09/22/2009)  Other Screening   PSA: Not documented   PSA action/deferral: Not indicated  (09/22/2009)   Smoking status: quit  (01/19/2010)  Diabetes Mellitus   HgbA1C: 7.8  (01/19/2010)    Eye exam: No diabetic retinopathy.    No macular edema.   (10/30/2008)   Eye exam due: 10/2009    Foot exam: yes  (01/19/2010)   Foot exam action/deferral: Do today   High risk foot: Yes  (01/19/2010)   Foot care education: Done  (01/19/2010)   Foot exam due: 07/27/2007    Urine microalbumin/creatinine ratio: 1333.8  (08/08/2008)    Diabetes flowsheet reviewed?: Yes   Progress toward A1C goal: Unchanged   Diabetes comments: confirmed in his lifestyle.  Lipids   Total Cholesterol: 151  (08/08/2008)   LDL: 59  (08/08/2008)   LDL Direct: Not documented    HDL: 56  (08/08/2008)   Triglycerides: 181  (08/08/2008)  Hypertension   Last Blood Pressure: 153 / 79  (01/19/2010)   Serum creatinine: 1.66  (05/08/2009)   Serum potassium 4.4  (05/08/2009)    Hypertension flowsheet reviewed?: Yes   Progress toward BP goal: Unchanged   Hypertension comments: cannot tolerate diuretic or any increase in other BP meds.  Self-Management Support :   Personal Goals (by the next clinic visit) :     Personal A1C goal: 7  (01/19/2010)     Personal blood pressure goal: 130/80  (01/19/2010)     Personal LDL goal: 100  (01/19/2010)    Patient will work on the following items until the next clinic visit to reach self-care goals:     Medications and monitoring: take my medicines every day  (01/19/2010)     Eating: eat foods that are low in salt, eat baked foods instead of fried foods  (01/19/2010)     Activity: take a 30 minute walk every day  (09/22/2009)     Other: no issues walking from parking lot to clinic  (09/22/2009)    Diabetes self-management support: Resources for patients handout  (01/19/2010)   Last diabetes self-management training by diabetes educator: 09/03/2007   Last medical  nutrition therapy: 09/03/2007    Hypertension self-management support: Resources for patients handout  (01/19/2010)      Resource handout printed.   Nursing Instructions: Diabetic foot exam today   Laboratory Results   Blood Tests   Date/Time Received: January 19, 2010 3:24 PM Date/Time Reported: Alric Quan  January 19, 2010 3:24 PM   HGBA1C: 7.8%   (Normal Range: Non-Diabetic - 3-6%   Control Diabetic - 6-8%) CBG Random:: 166mg /dL

## 2010-12-28 NOTE — Assessment & Plan Note (Signed)
Summary: EST-CK/FU/MEDS/CFB   Vital Signs:  Patient profile:   77 year old male Height:      70 inches (177.80 cm) Weight:      226.9 pounds (103.14 kg) BMI:     32.67 Temp:     98.5 degrees F (36.94 degrees C) oral Pulse rate:   70 / minute BP sitting:   149 / 85  (left arm)  Vitals Entered By: Cynda Familia Duncan Dull) (June 04, 2010 10:51 AM) CC: routine f/u, c/o recurrent left leg pain and feeling weak Is Patient Diabetic? Yes Did you bring your meter with you today? No Pain Assessment Patient in pain? no      Nutritional Status BMI of > 30 = obese  Have you ever been in a relationship where you felt threatened, hurt or afraid?No   Does patient need assistance? Functional Status Self care Ambulation Normal   Primary Care Provider:  Dr Aundria Rud  CC:  routine f/u and c/o recurrent left leg pain and feeling weak.  History of Present Illness: 29 man with CAD and DM. He says he has no energy and feels tired all day.  He is cheerful and says happily that he eats well.  He wonders if his medicine is too strong.  There are no specific symptoms of heart disease.  BP is upper normal and there are no orthostatic symptoms.  Preventive Screening-Counseling & Management  Alcohol-Tobacco     Alcohol type: once a month     Smoking Status: quit     Year Quit: years ago  Allergies: No Known Drug Allergies   Impression & Recommendations:  Problem # 1:  INSOMNIA (ICD-780.52) sleeps well.  does not take ambien.  Wife concerned that he sleeps during the day.  When he is awake, his personality and interests are unchanged.  He has no symptoms of depression.   His updated medication list for this problem includes:    Ambien 5 Mg Tabs (Zolpidem tartrate) .Marland Kitchen... 1 tab at bedtime  Problem # 2:  EDEMA (ICD-782.3) about the same.  wgt up 1 lb.  He only takes 40 of lasix once daily.  he is troubled by urinary frequency but denies incontinence.   His updated medication list for this  problem includes:    Furosemide 40 Mg Tabs (Furosemide) .Marland Kitchen... Take 1 tablet by mouth once a day  Problem # 3:  HYPERTENSION (ICD-401.9) cor regular w/o murmur.  Lungs clear.  3+ edema of both LEs. No chest pain and no use of NTG.  Sees Dr. Juanda Chance annually in October. No orthopnea.   His updated medication list for this problem includes:    Metoprolol Tartrate 25 Mg Tabs (Metoprolol tartrate) .Marland Kitchen... Take one tablet by mouth twice a day    Amlodipine Besylate 5 Mg Tabs (Amlodipine besylate) .Marland Kitchen... Take 1 tablet by mouth once a day    Benazepril Hcl 40 Mg Tabs (Benazepril hcl) .Marland Kitchen... Take 1 tablet by mouth two times a day    Furosemide 40 Mg Tabs (Furosemide) .Marland Kitchen... Take 1 tablet by mouth once a day  Orders: T-Basic Metabolic Panel [id number])  Problem # 4:  DIABETES MELLITUS, TYPE II (ICD-250.00) A1c = 7.9 today.  Does not check BG at home.  Has eye doc -- Dr. Elmer Picker. Nephrotic proteinuria -- on max dose of benazepril and BP nearly controlled. Last creatinine = 1.98.   His updated medication list for this problem includes:    Glipizide 10 Mg Tabs (Glipizide) .Marland Kitchen... Take 1 tablet  by mouth two times a day    Aspirin 81 Mg Tabs (Aspirin) .Marland Kitchen... Take one tablet by mouth once daily    Benazepril Hcl 40 Mg Tabs (Benazepril hcl) .Marland Kitchen... Take 1 tablet by mouth two times a day  Orders: T- Capillary Blood Glucose ([id number]) T-Hgb A1C (in-house) ([id number])  Complete Medication List: 1)  Metoprolol Tartrate 25 Mg Tabs (Metoprolol tartrate) .... Take one tablet by mouth twice a day 2)  Zocor 20 Mg Tabs (Simvastatin) .... Take one tablet by mouth once daily 3)  Glipizide 10 Mg Tabs (Glipizide) .... Take 1 tablet by mouth two times a day 4)  Flomax 0.4 Mg Cp24 (Tamsulosin hcl) .... Take as directed once daily 5)  Aspirin 81 Mg Tabs (Aspirin) .... Take one tablet by mouth once daily 6)  Omeprazole 20 Mg Cpdr (Omeprazole) .... Take 1 every other day 7)  Ultracet 37.5-325 Mg Tabs (Tramadol-acetaminophen)  .... Take 1 tablet by mouth three times a day as needed pain 8)  Nitroglycerin 0.4 Mg Subl (Nitroglycerin) .... Use as directed 9)  Amlodipine Besylate 5 Mg Tabs (Amlodipine besylate) .... Take 1 tablet by mouth once a day 10)  Benazepril Hcl 40 Mg Tabs (Benazepril hcl) .... Take 1 tablet by mouth two times a day 11)  Ambien 5 Mg Tabs (Zolpidem tartrate) .Marland Kitchen.. 1 tab at bedtime 12)  Furosemide 40 Mg Tabs (Furosemide) .... Take 1 tablet by mouth once a day  Other Orders: T-CBC No Diff ([id number])  Patient Instructions: 1)  Please schedule a follow-up appointment in 4 months. Prescriptions: OMEPRAZOLE 20 MG CPDR (OMEPRAZOLE) Take 1 every other day  #100 x 3   Entered and Authorized by:   Ulyess Mort MD   Signed by:   Ulyess Mort MD on 06/04/2010   Method used:   Print then Give to Patient   RxID:   [id number]  Process Orders Check Orders Results:     [facility]: Check successful Tests Sent for requisitioning (June 04, 2010 12:31 PM):     06/04/2010: [facility] -- T-Basic Metabolic Panel [id number] (signed)     06/04/2010: [facility] -- T-CBC No Diff [[id number]] (signed)    Prevention & Chronic Care Immunizations   Influenza vaccine: Fluvax 3+  (09/22/2009)    Tetanus booster: Not documented    Pneumococcal vaccine: Not documented    H. zoster vaccine: Not documented  Colorectal Screening   Hemoccult: Not documented   Hemoccult action/deferral: Not indicated  (09/22/2009)    Colonoscopy: Not documented   Colonoscopy action/deferral: Not indicated  (09/22/2009)  Other Screening   PSA: Not documented   PSA action/deferral: Not indicated  (09/22/2009)   Smoking status: quit  (06/04/2010)  Diabetes Mellitus   HgbA1C: 7.9  (06/04/2010)    Eye exam: No diabetic retinopathy.    No macular edema.   (10/30/2008)   Eye exam due: 10/2009    Foot exam: yes  (01/19/2010)   Foot exam action/deferral: Do  today   High risk foot: Yes  (01/19/2010)   Foot care education: Done  (01/19/2010)   Foot exam due: 07/27/2007    Urine microalbumin/creatinine ratio: 3044.3  (01/19/2010)  Lipids   Total Cholesterol: 151  (08/08/2008)   LDL: 59  (08/08/2008)   LDL Direct: Not documented   HDL: 56  (08/08/2008)   Triglycerides: 181  (08/08/2008)  Hypertension   Last Blood Pressure: 149 / 85  (06/04/2010)   Serum creatinine: 1.98  (01/19/2010)   Serum potassium  4.5  (01/19/2010)  Self-Management Support :   Personal Goals (by the next clinic visit) :     Personal A1C goal: 7  (01/19/2010)     Personal blood pressure goal: 130/80  (01/19/2010)     Personal LDL goal: 100  (01/19/2010)    Patient will work on the following items until the next clinic visit to reach self-care goals:     Medications and monitoring: take my medicines every day  (06/04/2010)     Eating: eat foods that are low in salt, eat baked foods instead of fried foods  (06/04/2010)     Activity: take a 30 minute walk every day  (09/22/2009)     Other: no issues walking from parking lot to clinic  (09/22/2009)    Diabetes self-management support: Pre-printed educational material, Resources for patients handout  (06/04/2010)   Last diabetes self-management training by diabetes educator: 09/03/2007   Last medical nutrition therapy: 09/03/2007    Hypertension self-management support: Pre-printed educational material, Resources for patients handout  (06/04/2010)      Resource handout printed.   Laboratory Results   Blood Tests   Date/Time Received: June 04, 2010 11:06 AM Date/Time Reported: Burke Keels  June 04, 2010 11:06 AM   HGBA1C: 7.9%   (Normal Range: Non-Diabetic - 3-6%   Control Diabetic - 6-8%) CBG Fasting:: 178mg /dL

## 2010-12-28 NOTE — Progress Notes (Signed)
Summary: test results  Phone Note Call from Patient Call back at Home Phone [phone]   Caller: Patient Reason for Call: Lab or Test Results Initial call taken by: Judie Grieve,  September 24, 2010 2:22 PM  Follow-up for Phone Call        I spoke with the pt. Follow-up by: Sherri Rad, RN, BSN,  September 24, 2010 3:38 PM

## 2010-12-28 NOTE — Assessment & Plan Note (Signed)
Summary: G2R   Primary Provider:  Dr Aundria Rud   History of Present Illness: Jeremy Sanders is 77 years old and return for followup management of CAD. He had bypass surgery in 1997. He underwent cardiac catheterization in 2004 and his LIMA graft was okay and the saphenous vein graft to the circumflex artery was okay but he had total occlusion of a ramus branch and severe distal disease in the LAD.  He's done well over the past year. He's had no chest pain source breath or palpitations.  He does say he has more difficulty walking and more trouble with his balance and weakness.  His other problems include hypertension, hyperlipidemia, and diabetes. He also has venous insufficiency of the lower extremities but he stopped taking his Lasix because of swelling with the same with or without the Lasix.  He also has chronic renal insufficiency with creatinines in the range of 1.8.  Current Medications (verified): 1)  Metoprolol Tartrate 25 Mg Tabs (Metoprolol Tartrate) .... Take One Tablet By Mouth Twice A Day 2)  Zocor 20 Mg Tabs (Simvastatin) .... Take One Tablet By Mouth Once Daily 3)  Glipizide 10 Mg Tabs (Glipizide) .... Take 1 Tablet By Mouth Two Times A Day 4)  Flomax 0.4 Mg Cp24 (Tamsulosin Hcl) .... Take As Directed Once Daily 5)  Aspirin 81 Mg Tabs (Aspirin) .... Take One Tablet By Mouth Once Daily 6)  Omeprazole 20 Mg Cpdr (Omeprazole) .... Take 1 Every Other Day 7)  Ultracet 37.5-325 Mg  Tabs (Tramadol-Acetaminophen) .... Take 1 Tablet By Mouth Three Times A Day As Needed Pain 8)  Nitroglycerin 0.4 Mg  Subl (Nitroglycerin) .... Use As Directed 9)  Amlodipine Besylate 5 Mg  Tabs (Amlodipine Besylate) .... Take 1 Tablet By Mouth Once A Day 10)  Benazepril Hcl 40 Mg Tabs (Benazepril Hcl) .... Take 1 Tablet By Mouth Two Times A Day 11)  Ambien 5 Mg Tabs (Zolpidem Tartrate) .Marland Kitchen.. 1 Tab At Bedtime 12)  Furosemide 40 Mg Tabs (Furosemide) .... Hold  Allergies (verified): No Known Drug  Allergies  Past History:  Past Medical History: Reviewed history from 09/23/2009 and no changes required. Cholelithiasis - Dr. Matthias Hughs Diabetes mellitus, type II Diverticulosis -GI Bleed 4/05 Hypertension Hyperthyroidism - persistent low TSH Renal insufficiency conorany artery disease-Dr. Juanda Chance proteinuria eosinophilia-since 2005 benign prostatic hypertrophy - Dr. Isabel Caprice Coronary artery status post coronary bypass graft surgery in 1997     with coronary anatomy as described above, now stable.  Hyperlipidemia. Venous insufficiency in lower extremities.   Review of Systems       ROS is negative except as outlined in HPI.   Vital Signs:  Patient profile:   77 year old male Height:      70 inches Weight:      237 pounds BMI:     34.13 Pulse rate:   61 / minute Resp:     18 per minute BP sitting:   132 / 86  (right arm)  Vitals Entered By: Marrion Coy, CNA (September 23, 2010 11:04 AM)  Physical Exam  Additional Exam:  Gen. Well-nourished, in no distress   Neck: No JVD, thyroid not enlarged, no carotid bruits Lungs: No tachypnea, clear without rales, rhonchi or wheezes Cardiovascular: Rhythm regular, PMI not displaced,  heart sounds  normal, no murmurs or gallops, 2-3+ bilateral peripheral edema, pulses normal in all 4 extremities. Abdomen: BS normal, abdomen soft and non-tender without masses or organomegaly, no hepatosplenomegaly. MS: No deformities, no cyanosis or clubbing  Neuro:  No focal sns   Skin:  no lesions    Impression & Recommendations:  Problem # 1:  CORONARY ARTERY BYPASS GRAFT, HX OF (ICD-V45.81) He had previous bypass surgery and coronary anatomy as described above. He's had no recent chest pain is from appears stable.  Problem # 2:  HYPERTENSION (ICD-401.9) This appears well controlled on current medications. His updated medication list for this problem includes:    Metoprolol Tartrate 25 Mg Tabs (Metoprolol tartrate) .Marland Kitchen... Take one tablet by  mouth twice a day    Aspirin 81 Mg Tabs (Aspirin) .Marland Kitchen... Take one tablet by mouth once daily    Amlodipine Besylate 5 Mg Tabs (Amlodipine besylate) .Marland Kitchen... Take 1 tablet by mouth once a day    Benazepril Hcl 40 Mg Tabs (Benazepril hcl) .Marland Kitchen... Take 1 tablet by mouth two times a day    Furosemide 40 Mg Tabs (Furosemide) ..... Hold  Orders: TLB-BMP (Basic Metabolic Panel-BMET) (80048-METABOL) TLB-CBC Platelet - w/Differential (85025-CBCD) TLB-Lipid Panel (80061-LIPID) TLB-Hepatic/Liver Function Pnl (80076-HEPATIC)  Problem # 3:  EDEMA (ICD-782.3) He has venous insufficiency of the lower extremities. He doesn't feel Lasix helps and the support hose have been difficult for him to use. He is elevating his feet will encourage that.  Problem # 4:  HYPERLIPIDEMIA-MIXED (ICD-272.4) He has not had a lipid profile in some time we'll plan to check that. His updated medication list for this problem includes:    Zocor 20 Mg Tabs (Simvastatin) .Marland Kitchen... Take one tablet by mouth once daily  Patient Instructions: 1)  FASTING labwork today: bmet/cbc/lipid/liver (414.01;272.2) 2)  Your physician wants you to follow-up in: 1 year with Dr. Clifton James.  You will receive a reminder letter in the mail two months in advance. If you don't receive a letter, please call our office to schedule the follow-up appointment. 3)  Your physician recommends that you continue on your current medications as directed. Please refer to the Current Medication list given to you today. Prescriptions: METOPROLOL TARTRATE 25 MG TABS (METOPROLOL TARTRATE) Take one tablet by mouth twice a day  #180 x 3   Entered by:   Sherri Rad, RN, BSN   Authorized by:   Lenoria Farrier, MD, [facility]   Signed by:   Sherri Rad, RN, BSN on 09/23/2010   Method used:   Print then Give to Patient   RxID:   [id number]

## 2010-12-30 NOTE — Consult Note (Signed)
Summary: [facility]  [facility]   Imported By: Louretta Parma 11/25/2010 16:39:10  _____________________________________________________________________  External Attachment:    Type:   Image     Comment:   External Document

## 2010-12-30 NOTE — Assessment & Plan Note (Signed)
Summary: fu visit/ds   Vital Signs:  Patient profile:   77 year old male Height:      70 inches (177.80 cm) Weight:      234.4 pounds (106.55 kg) BMI:     33.75 Temp:     97.4 degrees F (36.33 degrees C) oral Pulse rate:   56 / minute BP sitting:   176 / 89  (right arm)  Vitals Entered By: Cynda Familia Duncan Dull) (December 21, 2010 1:58 PM) CC: routine f/u Is Patient Diabetic? Yes Did you bring your meter with you today? No Pain Assessment Patient in pain? no      Nutritional Status BMI of > 30 = obese CBG Result 183  Have you ever been in a relationship where you felt threatened, hurt or afraid?No   Does patient need assistance? Ambulation Impaired:Risk for fall   Primary Care Provider:  Dr Aundria Rud  CC:  routine f/u.  History of Present Illness: 76 man with DM, HTN and CAD. Has annual visits with [facility]. Sees ophthalmologist. Major complaint is marked swelling of both legs and balance problems. Went to podiatrist but was repulsed by cost. Has not tolerated lasix due to inconvenient polyuria.  Preventive Screening-Counseling & Management  Alcohol-Tobacco     Alcohol type: once a month     Smoking Status: quit     Year Quit: years ago  Allergies: No Known Drug Allergies   Impression & Recommendations:  Problem # 1:  LEG PAIN (ICD-729.5) Will explore his request for a walker.  I endorse more exercise and balance is an issue.  Problem # 2:  EDEMA (ICD-782.3) Encouraged periods of elevation and occasional use of lasix.   His updated medication list for this problem includes:    Furosemide 40 Mg Tabs (Furosemide) ..... Hold  Problem # 3:  HYPERTENSION (ICD-401.9) Slightly higher than usual.  Wgt is also up due to edema. Will increase amlodipine 5 to 10 although this may aggravate edema. Will also encourage occasional use of lasix. Cor regular w/o murmur. 3+ edema. Lungs clear. Denies chest pain or orthopnea.   His updated medication list for  this problem includes:    Metoprolol Tartrate 25 Mg Tabs (Metoprolol tartrate) .Marland Kitchen... Take one tablet by mouth twice a day    Amlodipine Besylate 10 Mg Tabs (Amlodipine besylate) .Marland Kitchen... Take 1 tablet by mouth once a day    Benazepril Hcl 40 Mg Tabs (Benazepril hcl) .Marland Kitchen... Take 1 tablet by mouth two times a day    Furosemide 40 Mg Tabs (Furosemide) ..... Hold  Problem # 4:  RENAL INSUFFICIENCY (ICD-588.9) Has creat of 1.7 -- unchanged over 2 years.  Three grams of proteinuria  Problem # 5:  DIABETES MELLITUS, TYPE II (ICD-250.00) A1c = 8.0  unchanged. Wgt up 5 lbs, mostly edema. Has little exercise; will encourage. Does see eye doc regularly. Lipids very good. He does not want insulin and metformin is unavailable due to creatinine.   His updated medication list for this problem includes:    Glipizide 10 Mg Tabs (Glipizide) .Marland Kitchen... Take 1 tablet by mouth two times a day    Aspirin 81 Mg Tabs (Aspirin) .Marland Kitchen... Take one tablet by mouth once daily    Benazepril Hcl 40 Mg Tabs (Benazepril hcl) .Marland Kitchen... Take 1 tablet by mouth two times a day  Orders: T-Hgb A1C (in-house) (24401UU) T- Capillary Blood Glucose (72536)  Complete Medication List: 1)  Metoprolol Tartrate 25 Mg Tabs (Metoprolol tartrate) .... Take one tablet by mouth  twice a day 2)  Zocor 20 Mg Tabs (Simvastatin) .... Take one tablet by mouth once daily 3)  Glipizide 10 Mg Tabs (Glipizide) .... Take 1 tablet by mouth two times a day 4)  Flomax 0.4 Mg Cp24 (Tamsulosin hcl) .... Take as directed once daily 5)  Aspirin 81 Mg Tabs (Aspirin) .... Take one tablet by mouth once daily 6)  Omeprazole 20 Mg Cpdr (Omeprazole) .... Take 1 every other day 7)  Ultracet 37.5-325 Mg Tabs (Tramadol-acetaminophen) .... Take 1 tablet by mouth three times a day as needed pain 8)  Nitroglycerin 0.4 Mg Subl (Nitroglycerin) .... Use as directed 9)  Amlodipine Besylate 10 Mg Tabs (Amlodipine besylate) .... Take 1 tablet by mouth once a day 10)  Benazepril Hcl  40 Mg Tabs (Benazepril hcl) .... Take 1 tablet by mouth two times a day 11)  Ambien 5 Mg Tabs (Zolpidem tartrate) .Marland Kitchen.. 1 tab at bedtime 12)  Furosemide 40 Mg Tabs (Furosemide) .... Hold 13)  Rollator Misc (Misc. devices) .... Please provide for gait abnormality and leg pain 14)  Walker Misc (Misc. devices) .... Please provide for gait abnormality and leg pain  Patient Instructions: 1)  Please schedule a follow-up appointment in 4 months. Prescriptions: AMLODIPINE BESYLATE 10 MG TABS (AMLODIPINE BESYLATE) Take 1 tablet by mouth once a day  #100 x 3   Entered and Authorized by:   Ulyess Mort MD   Signed by:   Ulyess Mort MD on 12/21/2010   Method used:   Print then Give to Patient   RxID:   [id number] AMLODIPINE BESYLATE 10 MG TABS (AMLODIPINE BESYLATE) Take 1 tablet by mouth once a day  #31 x 11   Entered and Authorized by:   Ulyess Mort MD   Signed by:   Ulyess Mort MD on 12/21/2010   Method used:   Print then Give to Patient   RxID:   [id number] QQVZDG  MISC (MISC. DEVICES) please provide for gait abnormality and leg pain  #1 x 0   Entered by:   Cynda Familia (AAMA)   Authorized by:   Ulyess Mort MD   Signed by:   Cynda Familia (AAMA) on 12/21/2010   Method used:   Print then Give to Patient   RxID:   [id number] ROLLATOR  MISC (MISC. DEVICES) please provide for gait abnormality and leg pain  #1 x 0   Entered by:   Cynda Familia (AAMA)   Authorized by:   Ulyess Mort MD   Signed by:   Cynda Familia (AAMA) on 12/21/2010   Method used:   Print then Give to Patient   RxID:   [id number] GLIPIZIDE 10 MG TABS (GLIPIZIDE) Take 1 tablet by mouth two times a day  #200 x 3   Entered by:   Cynda Familia (AAMA)   Authorized by:   Ulyess Mort MD   Signed by:   Cynda Familia (AAMA) on 12/21/2010   Method used:   Print then Give to Patient   RxID:   [id number] FLOMAX 0.4 MG CP24 (TAMSULOSIN HCL) take as directed once  daily  #100 x 3   Entered by:   Cynda Familia (AAMA)   Authorized by:   Ulyess Mort MD   Signed by:   Cynda Familia (AAMA) on 12/21/2010   Method used:   Print then Give to Patient   RxID:   [id number] ZOCOR 20 MG TABS (SIMVASTATIN) take one tablet by mouth once daily  #100 x 3   Entered by:  Cynda Familia (AAMA)   Authorized by:   Ulyess Mort MD   Signed by:   Cynda Familia (AAMA) on 12/21/2010   Method used:   Print then Give to Patient   RxID:   [id number] AMLODIPINE BESYLATE 5 MG  TABS (AMLODIPINE BESYLATE) Take 1 tablet by mouth once a day  #100 x 3   Entered by:   Cynda Familia (AAMA)   Authorized by:   Ulyess Mort MD   Signed by:   Cynda Familia (AAMA) on 12/21/2010   Method used:   Print then Give to Patient   RxID:   [id number] BENAZEPRIL HCL 40 MG TABS (BENAZEPRIL HCL) Take 1 tablet by mouth two times a day  #100 x 3   Entered by:   Cynda Familia (AAMA)   Authorized by:   Ulyess Mort MD   Signed by:   Cynda Familia (AAMA) on 12/21/2010   Method used:   Print then Give to Patient   RxID:   [id number]    Orders Added: 1)  T-Hgb A1C (in-house) [63016WF] 2)  T- Capillary Blood Glucose [82948] 3)  Est. Patient Level IV [09323]    Laboratory Results   Blood Tests   Date/Time Received: December 21, 2010 2:12 PM Date/Time Reported: Alric Quan  December 21, 2010 2:12 PM   HGBA1C: 8.0%   (Normal Range: Non-Diabetic - 3-6%   Control Diabetic - 6-8%) CBG Random:: 183mg /dL  Comments: Patient ate at  [facility]  December 21, 2010 2:13 PM

## 2011-01-06 ENCOUNTER — Encounter: Payer: Self-pay | Admitting: Internal Medicine

## 2011-03-03 LAB — GLUCOSE, CAPILLARY: Glucose-Capillary: 141 mg/dL — ABNORMAL HIGH (ref 70–99)

## 2011-03-15 LAB — GLUCOSE, CAPILLARY: Glucose-Capillary: 196 mg/dL — ABNORMAL HIGH (ref 70–99)

## 2011-03-25 ENCOUNTER — Encounter: Payer: Self-pay | Admitting: Internal Medicine

## 2011-04-12 NOTE — Assessment & Plan Note (Signed)
[facility]                            CARDIOLOGY OFFICE NOTE   WM, SAHAGUN                       MRN:          [id number]  DATE:09/24/2008                            DOB:          11-Dec-1924    PRIMARY CARE PHYSICIAN:  C. Ulyess Mort, MD   CLINICAL HISTORY:  Jeremy Sanders is now 77 years old and returns for  followup management of his coronary heart disease.  He had coronary  bypass graft surgery in 1997.  He had abnormal Myoview scan in 2004 and  underwent catheterization and was found to have patent LIMA to the LAD  and a patent vein graft to the posterolateral and posterior descending  branch of the circumflex artery, but he had occlusion of a ramus branch,  which filled via collaterals and he had severe disease in his distal  LAD.  We recommended medical therapy.   He says he has done well since that time.  He has had no chest pain,  shortness of breath, or palpitations.  He has had chronic edema in lower  extremities, although he does not think it has gotten any worse.   His past medical history is significant for hypertension and  hyperlipidemia and venous insufficiency in the lower extremities.   His current medications include amlodipine 5 mg daily, Lotensin 40 mg  daily, metoprolol 25 mg b.i.d., Zocor 20 mg daily, glipizide, Flomax,  aspirin, Prilosec, zolpidem, and furosemide 40 mg b.i.d.   On examination, the blood pressure is 150/80 and pulse 55 and regular.  He brought in blood pressures from and he had frequent readings in the  160s, 170s, and 180s.  There was no venous tension.  The carotid pulses  were full and there were without bruits.  The chest was clear without  rales or rhonchi.  The cardiac rhythm is regular.  He has no murmurs or  gallops.  The abdomen is soft, normal bowel sounds.  Peripheral pulse  were full.  There was marked edema of lower extremities with some  erythema.  Edema was 2 to 3+.   His  electrocardiogram showed left axis deviation, incomplete right  bundle-branch block.   IMPRESSION:  1. Coronary artery status post coronary bypass graft surgery in 1997      with coronary anatomy as described above, now stable.  2. Good left ventricular function.  3. Hypertension under optimal control.  4. Hyperlipidemia.  5. Venous insufficiency in lower extremities.   RECOMMENDATIONS:  Jeremy Sanders venous insufficiency is quite striking.  He is on Procardia, which could aggravate this, but I am reluctant to  stop this because his blood pressures have been poorly controlled.  We  will try a support hose and see if this will give him any help.  His  blood pressure has been poorly controlled even on Norvasc, beta-blocker,  and ACE inhibitor.  I am reluctant to go up on the ACE inhibitor because  of his renal insufficiency and reluctant to go up on his beta-blocker  because of his slow heart rate.  We will add Catapres 0.2 b.i.d. and  get  a repeat blood pressure check in a week.  He has got to see Dr. Aundria Rud  in January.  I will see him back in followup in a year or now I consider  doing a scan 12 years out from bypass surgery, but I think it would be  very difficult to interpret with his near total distal LAD and his total  ramus.     Jeremy Elvera Lennox Juanda Chance, MD, [facility]  Electronically Signed    BRB/MedQ  DD: 09/24/2008  DT: 09/25/2008  Job #: [id number]

## 2011-04-15 NOTE — Op Note (Signed)
NAMETHEOPHILE, Jeremy Sanders NO.:  [id number]   MEDICAL RECORD NO.:  [id number]                   PATIENT TYPE:  INP   LOCATION:  3731                                 FACILITY:  [facility]   PHYSICIAN:  Bernette Redbird, M.D.                DATE OF BIRTH:  01/08/1925   DATE OF PROCEDURE:  03/12/2004  DATE OF DISCHARGE:                                 OPERATIVE REPORT   PROCEDURE:  Upper endoscopy.   PROCEDURE:  Upper endoscopy.   INDICATION:  This is a 77 year old gentleman readmitted to the hospital  yesterday because of recurrent painless hematochezia with loose stools.  He  just underwent colonoscopy which showed pancolonic diverticulosis, felt most  likely to account for his bleeding, but there was some dark blood  inspissated in his proximal diverticula, raising the possibility of a more  proximal source of bleeding.  Note that the patient is status post CABG and  was on aspirin without PPI coverage prior to admission.   FINDINGS:  Erythematous but nonerosive antral and duodenal inflammation.   PROCEDURE:  The nature, purpose, risks of the procedure have been reviewed  with the patient who provided written consent.  No additional sedation was  administered for this procedure; he had received fentanyl 40 mcg and Versed  4 mg for the colonoscopy, which immediately preceded it, and there was no  problem with arrhythmias or desaturation during either procedure.   The Olympus video endoscope was passed under direct vision, entering the  esophagus without difficulty.  The vocal cords were not well seen, but no  gross laryngeal abnormalities were seen.   The esophagus had prominent esophageal veins consistent with his advanced  age, but no frank varices, reflux esophagitis, Barrett's esophagus,  neoplasia or infection were noted.  He appeared to have some distal  esophageal rings.  There was a moderate sized 4 cm hiatal hernia.   The stomach contained no blood  or coffee ground material, just a little bit  of amber-colored fluid.  The antrum of the stomach as well as the duodenal  bulb had some patchy erythema but no erosions, ulcers, polyps or masses were  seen either in the stomach or the duodenum.  A retroflexed view showed a  slightly patulous diaphragmatic hiatus.   The scope was removed from the patient.  No biopsies were obtained.  He  tolerated the procedure well and there were no apparent complications.   IMPRESSION:  1. No active bleeding, blood in the stomach or perspective bleeding site to     account for the patient's recent hematochezia noted on this examination.  2. Moderate-sized hiatal hernia.  3. Erythema and duodenitis, nonerosive in character, consistent with aspirin     exposure.   PLAN:  In view of the observed inflammation, consideration might be given to  daily or every other day dosing with a PPI such as Prilosec OTC as  prophylaxis against aspirin-induced ulceration in the future.                                               Bernette Redbird, M.D.    RB/MEDQ  D:  03/12/2004  T:  03/15/2004  Job:  [id number]   cc:   C. Ulyess Mort, M.D.  [address]  [place]  [place] [address]  Fax: [phone]   Patient's [facility] Chart

## 2011-04-15 NOTE — Consult Note (Signed)
NAMEBENFORD, Jeremy Sanders NO.:  [id number]   MEDICAL RECORD NO.:  [id number]                   PATIENT TYPE:  INP   LOCATION:  3731                                 FACILITY:  [facility]   PHYSICIAN:  Bernette Redbird, M.D.                DATE OF BIRTH:  12/16/1924   DATE OF CONSULTATION:  03/12/2004  DATE OF DISCHARGE:                                   CONSULTATION   The internal medicine teaching service asked me to see this 77 year old  gentleman, husband of one of my patients, followed by Dr. Gary Fleet  in the internal medicine outpatient clinic because of recurrent painless  hematochezia coupled with loose stools, occurring in a stuttering fashion  over the past six days or so.  He was in and out of the hospital earlier  this week and today was found to have a hemoglobin of 9.9 for which reason a  transfusion was to be initiated.  Of note, he is on a daily aspirin. To my  knowledge, he has not had any previous GI tract pathology.   ALLERGIES:  No known allergies.   OUTPATIENT MEDICATIONS:  Benazepril, daily aspirin, Flomax, Lasix,  Lopressor, and Zocor.   OPERATION:  CABG seven years ago.   MEDICAL ILLNESSES:  Type 2 diabetes, hypertension, hypercholesterolemia.   HABITS:  Nonsmoker, nondrinker.   FAMILY HISTORY:  Negative for GI illnesses.   SOCIAL HISTORY:  Married to one of my patients, originally from [place].   REVIEW OF SYSTEMS:  Not obtained in detail.   PHYSICAL EXAMINATION:  GENERAL:  A pleasant, well-nourished, elderly male in  no evident distress, in fact, in rather good cheer.  HEENT:  Anicteric.  No evident pallor at this time.  CHEST: Clear.  HEART:  Without gallops, rubs, murmurs, clicks, or arrhythmias.  ABDOMEN:  Very soft, somewhat scaphoid configuration.  Completely nontender.  No organomegaly, guarding, or masses.   LABORATORY AND X-RAY DATA:  Hemoglobin today 9.9, platelets 151,000, white  count normal.  Troponin  negative.  He has chronic renal insufficiency with a  BUN of 34 and a creatinine of 2.0.  Liver chemistries normal.  Albumin 2.9.  INR normal.   IMPRESSION:  Recurrent painless hematochezia most consistent with a lower  tract bleed.  Etiologies which would come to mind would include diverticular  hemorrhage, vascular ectasia, dysenteric diarrhea as from an infectious  colitis, or rapid transit upper tract bleed (doubt).   PLAN:  Proceed to colonoscopic evaluation with a subsequent endoscopy if the  lower tract evaluation is equivocal for a source of bleeding.  The nature,  purpose, and risks of these tests were reviewed, and he is agreeable to  proceed.  Further management to depend on those findings.  Bernette Redbird, M.D.    RB/MEDQ  D:  03/12/2004  T:  03/14/2004  Job:  [id number]   cc:   C. Ulyess Mort, M.D.  [address]  [place]  [place] [address]  Fax: [phone]

## 2011-04-15 NOTE — Op Note (Signed)
Jeremy Sanders, Jeremy Sanders NO.:  [id number]   MEDICAL RECORD NO.:  [id number]                   PATIENT TYPE:  INP   LOCATION:  3731                                 FACILITY:  [facility]   PHYSICIAN:  Bernette Redbird, M.D.                DATE OF BIRTH:  [birth year]   DATE OF PROCEDURE:  03/12/2004  DATE OF DISCHARGE:                                 OPERATIVE REPORT   PROCEDURE:  Colonoscopy.   INDICATION:  This is a 77 year old gentleman readmitted to the hospital  yesterday with recurrent hematochezia, painless, associated with loose  stools.  His hemoglobin drifted down to 9.9.   FINDINGS:  Pancolonic diverticulosis.   PROCEDURE:  The nature, purpose and risks of the procedure have been  discussed with the patient who provides written consent.  Sedation was  fentanyl 40 mcg and Versed 4 mg IV without arrhythmias or desaturation.  On  digital exam the prostate was unremarkable.  The Olympus __________ video  colonoscope was advanced around the colon following a partial MiraLax prep.  I was able to reach the midportion of the cecum and look down into the base  of the cecum, so it is felt that areas were well seen.  The quality of the  prep was fairly good so it is felt that no major lesions would have been  missed, although small mucosal abnormalities might not have been seen.   The main finding on this exam was pancolonic diverticulosis.  No polyps,  cancer or colitis were observed and I did not appreciate any vascular  ectasia.   There was no active bleeding or fresh blood noted on this exam.  Distally,  there was some dark old blood, motor oil fluid, whereas proximally the  fecal effluent was clearer and more amber in color.  However, there was some  dark stool inspissated in some of his proximal diverticula suggesting that  at some time, blood had been present in the proximal colon, either due to a  more proximal bleed from above the ileocecal valve  (doubt) or from reflux of  blood from the more distal colon.   Retroflexion in the rectum and reinspection of the rectum were unremarkable.  No biopsies were obtained.  The patient tolerated the procedure well and  there were no apparent complications.   IMPRESSION:  1. No active bleeding at the time of this exam.  2. Dark effluent distally with lighter-colored effluent proximally, but with     some dark stool inspissated in proximal  diverticula, so the location of     the origin of this patient's recent bleeding is indeterminate.  3. Pancolonic diverticulosis which would be the most likely explanation for     his hemorrhage.   RECOMMENDATIONS:  Continued observation, okay to advance diet, proceed to  endoscopic evaluation to confirm the absence of an upper tract source of the  bleeding.  Bernette Redbird, M.D.    RB/MEDQ  D:  03/12/2004  T:  03/15/2004  Job:  [id number]   cc:   C. Ulyess Mort, M.D.  [address]  [place]  [place] [address]  Fax: [phone]

## 2011-04-15 NOTE — Cardiovascular Report (Signed)
Jeremy Sanders, Jeremy Sanders NO.:  [id number]   MEDICAL RECORD NO.:  [id number]                   PATIENT TYPE:  OIB   LOCATION:  2869                                 FACILITY:  [facility]   PHYSICIAN:  Charlies Constable, M.D.                  DATE OF BIRTH:  1925/06/05   DATE OF PROCEDURE:  10/03/2003  DATE OF DISCHARGE:                              CARDIAC CATHETERIZATION   CLINICAL HISTORY:  [name] is 77 years old and is seven years post  bypass surgery.  He recently had a Cardiolite scan which suggested apical  ischemia and for this reason he was scheduled for evaluation with  angiography.  He has had no recent chest pain.   PROCEDURE:  The procedure was performed via the right femoral artery using  arterial sheath and 6-French preformed coronary catheters.  A front wall  arterial puncture was performed and Omnipaque contrast was used.  JR4  catheter was used for injection of the vein graft.  A shaped LIMA catheter  was used for injection of a LIMA graft.  We had difficulty selectively  engaging this with the standard LIMA catheter.  No left ventriculogram was  performed to avoid contrast.  Baseline creatinine was 1.5.  The right  femoral artery was closed with AngioSeal at the end of the procedure.  The  patient tolerated the procedure well and left the laboratory in satisfactory  condition.   RESULTS:  Left main coronary artery:  Completely occluded near its origin.   Left anterior descending artery:  Nondominant vessel that had an 80%  proximal stenosis which had not changed from the preoperative studies.   The saphenous vein graft to the marginal and posterolateral branch of the  circumflex artery was patent and functioned normally.  This also filled an  intermediate branch which was not grafted and which was totally occluded.   The LIMA graft to the LAD was patent and functioned normally but there was  diffuse disease in the distal LAD with 90%  stenosis in the mid portion and  80% stenosis in the distal portion.  The reference lumen was only about 1.5  mm   No left ventriculogram was performed to minimize contrast.   The aortic pressure was 134/78 with a mean of 104 and left ventricular  pressure was 134/11.   CONCLUSIONS:  1. Coronary artery disease status post prior coronary artery bypass surgery.  2. Severe native vessel disease with total occlusion left main coronary     artery and 80% narrowing in the nondominant right coronary artery.  3. Patent vein graft to the marginal and posterolateral branch of the     circumflex artery which also fills a ramus branch which was not grafted     and a patent left internal mammary artery graft to the left anterior     descending with 90 and 80% stenosis in the  distal left anterior     descending.  4. Normal left ventricular function by recent Cardiolite.    RECOMMENDATIONS:  The patient has disease in the native vessel in the LAD  and also potential ischemia in the ramus branch of the circumflex artery.  The LAD vessel is diffusely diseased and is a small vessel and is not stable  for a percutaneous intervention and the patient is currently not having  angina.  Will plan continued medical therapy.                                               Charlies Constable, M.D.    BB/MEDQ  D:  10/03/2003  T:  10/03/2003  Job:  [id number]   cc:   C. Ulyess Mort, M.D.  [address]  [place]  [place] [address]  Fax: [phone]   [facility]

## 2011-04-15 NOTE — Assessment & Plan Note (Signed)
[facility]                              CARDIOLOGY OFFICE NOTE   LOEL, BETANCUR                       MRN:          [id number]  DATE:08/28/2006                            DOB:          June 24, 1925    PRIMARY CARE PHYSICIAN:  Dr. Ulyess Mort.   CLINICAL HISTORY:  Mr. Clabo is 77 years old and had coronary bypass  surgery in 1997.  He had a borderline abnormal Cardiolite scan in 2004,  underwent catheterization at that time, and was found to have patent graft  but had a ramus that was not grafted that was occluded and distal disease in  the LAD.  He has had good LV function.   He has done quite well since that time.  He has had no recent chest pain,  shortness of breath, or palpitations.   PAST MEDICAL HISTORY:  Significant for hypertension, hyperlipidemia.  He has  also had a low TSH and this has been followed by Dr. Aundria Rud.  He has also  had some lower extremity edema attributed to venous insufficiency.   CURRENT MEDICATIONS:  Glipizide, Prilosec, aspirin, Flomax, furosemide,  Zocor, Benazepril, metoprolol.   PHYSICAL EXAMINATION:  On examination, the blood pressure was 136/74 and the  pulse 67 and regular.  There was no venous distention.  The carotids were  full, without bruits.  CHEST:  Clear.  CARDIO RHYTHM:  Regular.  I appreciated no murmurs or gallops.  ABDOMEN:  Soft, no organomegaly.  PERIPHERAL PULSES:  Full, there was no peripheral edema.   IMPRESSION:  1. Coronary artery disease status post coronary artery bypass grafts in      1997 with coronary anatomy as described above.  2. Good LV function.  3. Hypertension.  4. Hyperlipidemia.  5. Venous insufficiency to lower extremities.   RECOMMENDATIONS:  Mr. Wiedeman is doing quite well.  I encouraged him to  exercise regularly and continue to work on losing weight.  His blood  pressure is good and he indicates  Dr. Aundria Rud said that his cholesterol levels are good.  We  will plan to see  him back in cardiac followup in a year or sooner if he develops any current  problems.            ______________________________  Everardo Beals Juanda Chance, MD, [facility]     BRB/MedQ  DD:  08/28/2006  DT:  08/29/2006  Job #:  [id number]

## 2011-04-15 NOTE — Discharge Summary (Signed)
NAMEASHKAN, CHAMBERLAND NO.:  [id number]   MEDICAL RECORD NO.:  [id number]                   PATIENT TYPE:  INP   LOCATION:  3731                                 FACILITY:  [facility]   PHYSICIAN:  Mont Dutton, MD                     DATE OF BIRTH:  September 19, 1925   DATE OF ADMISSION:  03/11/2004  DATE OF DISCHARGE:  03/15/2004                                 DISCHARGE SUMMARY   DISCHARGE DIAGNOSES:  1. Hematochezia secondary to lower gastrointestinal bleeding secondary to     problem #2.  2. Pan colonic diverticulosis.  3. Moderate-sized hiatal hernia.  4. Duodenitis nonerosive secondary to aspirin exposure.  5. Normocytic anemia, post hemorrhagic.  6. History of coronary artery disease, status post coronary artery bypass     graft.  7. Mild chronic obstructive pulmonary disease.  8. Hypertension.  9. Benign prostatic hypertrophy.  10.      History of dyslipidemia.  11.      Urinary tract infection.   DISCHARGE MEDICATIONS:  1. Prilosec 20 mg, one capsule every other day.  2. Flomax 0.4 mg every day.  3. Zocor 20 mg every evening.  4. Lopressor 12.5 mg, two tablets a day.  5. Ferrous sulfate 325 mg three times a day.  6. Ciprofloxacin 250 mg twice a day for two days.  7. The patient will remain off aspirin for two weeks, and then may restart     in previous dose.   DISPOSITION:  The patient was discharged home with followup with Dr. Aundria Rud  in outpatient clinic on April 20, at 8:45 a.m.   PROCEDURES:  1. EGD was done on March 12, 2004.  Impression:  No active bleeding, blood     in the stoma or perspective bleeding site to account for the patient's     recent hematochezia noticed on this examination.  Moderate sized hiatal     hernia.  Erythema and duodenitis, nonerosive in character, consistent     with aspirin exposure.  2. Colonoscopy on March 12, 2004.  Impression:  No active bleeding at the     time of this exam.  Dark effluent distally with  light-colored effluent     proximally. Biopsies done. Dark stool inspissated in proximal     diverticula, soft, with occasional  __________.  This patient's recent     __________ is undeterminate.  Pancolonic diverticulosis which would be     the most likely explanation for this hemorrhage.   DISPOSITION:  The patient is discharged home with followup appointment with  Dr. Aundria Rud in outpatient clinic on April 20, at 8:45 a.m.   HISTORY OF PRESENT ILLNESS:  This 77 year old white male comes to the  emergency room on April 14 after leaving AMA on the day prior to admission  due to family issues.  He has been having five days of diarrhea with  blood  in stool without nausea or vomiting.  The patient had had about two weeks of  constipation before this episode.  The patient denied __________ , denied  chest pain or denied palpitations.  He stated that he never had colonoscopy  but the patient said he never had one.  He does not want any invasive  intervention.   PHYSICAL EXAMINATION:  VITAL SIGNS:  Temperature 98.1, respiratory rate 20  but irregular secondary to COPD.  Blood pressure 115/65.  Oxygen saturation  95% on room air.  GENERAL:  He is awake, alert.  HEENT:  Eyes:  PERRLA/ EOMI.  Ears normal.  Nose normal.  Throat, no  exudate.  NECK:  No JVD.  RESPIRATIONS:  Clear to auscultation bilaterally.  No rales, crackles.  CARDIAC:  Irregular heart rate, no murmur.  GI: Soft bowel sounds present.  No tenderness.  GI is guaiac positive.  EXTREMITIES:  No edema.  Pulse is present.  SKIN:  No rash, no lymphadenopathy.  NEUROLOGIC:  Grossly intact.   LABORATORY DATA:  On admission:  White blood cells 7.6, red blood cells  3.61, hemoglobin 10.9, hematocrit 32.1.  MCV 88.9.  RDW 15.3.  Platelets  168,000.  Neutrophils 5.5, lymphocytes 1.4, monocytes, 0.3, basophils 0.  PT  14.4, INR 1.2, PTT 28.  Sodium 140, potassium 4.1, chloride 110, CO2 of 24,  glucose 212, BUN 34, creatinine 2.0,  calcium 8.3.  Total protein 5.3,  albumin 2.9.  AST 17, ALT 12, ALP 50, and total bilirubin 0.8.  Cardiac  markers:  CK 100.09, CK-MB 2.7, relative index 2.5, and troponin less than  0.01.  Next set CK 108, CK-MB 2.5, relative index 2.3, and troponin 0.01.  Last set, CK 146, CK-MB 2.9, relative index 2, troponin less than 0.01.   HOSPITAL COURSE:  The patient was admitted with:  1. Problem #1.  Hematochezia secondary to lower-GI bleed, secondary to     ongoing diverticulosis.  The patient was admitted with hematochezia     secondary to lower-GI bleed.  He was stable on admission with hemoglobin     of 10.9.  On the following day of his hospital stay, his hemoglobin     decreased to 9.2.  He was transfused at this point one unit of red blood     cells.  On April 16, the patient underwent EGD and colonoscopy.  EGD     showed moderate-sized hiatal hernia, erythema and duodenitis.  There was     no active bleeding at the time of exam.  Colonoscopy was done on April     14, and it showed the patient with ongoing diverticulosis with would be     the most likely explanation for this hematochezia.  By the time of     discharge, the patient was stable with no signs of continued bleeding.     The patient was given one unit of red blood cells secondary to anemia and     morbidities such as coronary artery disease.   On discharge, the patient was given the recommendation for high-fiber diet  and a continuous proton pump inhibitor such as Prilosec or other products.   LABORATORY DATA:  Labs on discharge:  White blood cells 6.5, red blood cells  3.0, hemoglobin 10.4, hematocrit 30.7, MCV 87.8, platelets 148,000.  Mont Dutton, MD    OB/MEDQ  D:  05/03/2004  T:  05/04/2004  Job:  [id number]

## 2011-05-06 ENCOUNTER — Ambulatory Visit (INDEPENDENT_AMBULATORY_CARE_PROVIDER_SITE_OTHER): Payer: Medicare Other | Admitting: Internal Medicine

## 2011-05-06 ENCOUNTER — Encounter: Payer: Self-pay | Admitting: Internal Medicine

## 2011-05-06 VITALS — BP 174/87 | HR 65 | Temp 97.3°F | Ht 70.0 in | Wt 237.7 lb

## 2011-05-06 DIAGNOSIS — I10 Essential (primary) hypertension: Secondary | ICD-10-CM

## 2011-05-06 DIAGNOSIS — E119 Type 2 diabetes mellitus without complications: Secondary | ICD-10-CM

## 2011-05-06 LAB — GLUCOSE, CAPILLARY: Glucose-Capillary: 132 mg/dL — ABNORMAL HIGH (ref 70–99)

## 2011-05-06 NOTE — Progress Notes (Signed)
50 man with HTN and DM.  He is doing very well -- no new complaints.  Has massive edema and had opted to never take lasix due to aversion to polyuria.  Denies any orthopnea or disabling DOE. No chest pain.   Saw urologist Isabel Caprice).  Says he was advised that nothing could or needed to be done. Insomnia but does not take ambien.  Reports "depression" but seems cheerful and has good appetite.  Expecting several relatives this weekend and is mildly ambivalent.  No actual anhedonia.  Lungs clear.  Cor regular w/o murmur.  3-4+ LE edema bilat.  Abd non-tender.  No carotid bruits.  Printers not working today so refills hand-written:  Benazepril 40 bid  #200 with 3 refills  Omeprazole 20 qd  #100  x 3  Nitroglycerin prn  #50  x 3

## 2011-05-06 NOTE — Assessment & Plan Note (Signed)
Unlikely to improve control w/o diuretic.  No CV sx.

## 2011-05-06 NOTE — Assessment & Plan Note (Signed)
A1c = 7.5.  Apparantly on meds as listed.  Will check about eye app't.

## 2011-05-07 LAB — BASIC METABOLIC PANEL
BUN: 36 mg/dL — ABNORMAL HIGH (ref 6–23)
Chloride: 109 mEq/L (ref 96–112)
Creat: 2.05 mg/dL — ABNORMAL HIGH (ref 0.50–1.35)
Glucose, Bld: 125 mg/dL — ABNORMAL HIGH (ref 70–99)
Potassium: 4.4 mEq/L (ref 3.5–5.3)
Sodium: 140 mEq/L (ref 135–145)

## 2011-05-07 LAB — MICROALBUMIN / CREATININE URINE RATIO: Microalb Creat Ratio: 4023.4 mg/g — ABNORMAL HIGH (ref 0.0–30.0)

## 2011-06-28 ENCOUNTER — Ambulatory Visit (INDEPENDENT_AMBULATORY_CARE_PROVIDER_SITE_OTHER): Payer: Medicare Other | Admitting: Internal Medicine

## 2011-06-28 ENCOUNTER — Encounter: Payer: Self-pay | Admitting: Internal Medicine

## 2011-06-28 DIAGNOSIS — E0590 Thyrotoxicosis, unspecified without thyrotoxic crisis or storm: Secondary | ICD-10-CM

## 2011-06-28 DIAGNOSIS — R531 Weakness: Secondary | ICD-10-CM | POA: Insufficient documentation

## 2011-06-28 DIAGNOSIS — E119 Type 2 diabetes mellitus without complications: Secondary | ICD-10-CM

## 2011-06-28 DIAGNOSIS — R5383 Other fatigue: Secondary | ICD-10-CM

## 2011-06-28 DIAGNOSIS — I10 Essential (primary) hypertension: Secondary | ICD-10-CM

## 2011-06-28 DIAGNOSIS — G2589 Other specified extrapyramidal and movement disorders: Secondary | ICD-10-CM

## 2011-06-28 LAB — BASIC METABOLIC PANEL
BUN: 35 mg/dL — ABNORMAL HIGH (ref 6–23)
CO2: 22 mEq/L (ref 19–32)
Chloride: 106 mEq/L (ref 96–112)
Creat: 2.16 mg/dL — ABNORMAL HIGH (ref 0.50–1.35)
Glucose, Bld: 205 mg/dL — ABNORMAL HIGH (ref 70–99)

## 2011-06-28 LAB — CBC
MCH: 29.8 pg (ref 26.0–34.0)
MCHC: 33.4 g/dL (ref 30.0–36.0)
Platelets: 212 10*3/uL (ref 150–400)
RDW: 12.7 % (ref 11.5–15.5)

## 2011-06-28 LAB — DIFFERENTIAL
Eosinophils Absolute: 0.7 10*3/uL (ref 0.0–0.7)
Eosinophils Relative: 8 % — ABNORMAL HIGH (ref 0–5)
Lymphs Abs: 1.3 10*3/uL (ref 0.7–4.0)
Monocytes Relative: 7 % (ref 3–12)

## 2011-06-28 LAB — TSH: TSH: 0.437 u[IU]/mL (ref 0.350–4.500)

## 2011-06-28 MED ORDER — FUROSEMIDE 20 MG PO TABS: 20.0000 mg | ORAL_TABLET | Freq: Two times a day (BID) | ORAL | Status: DC

## 2011-06-28 MED ORDER — SERTRALINE HCL 50 MG PO TABS: 50.0000 mg | ORAL_TABLET | Freq: Every day | ORAL | Status: DC

## 2011-06-28 NOTE — Assessment & Plan Note (Signed)
CBG today is 188. Hemoglobin A1c in June 2012 was 7.5.  Adequately controlled. It is not seems that patient is hypoglycemic which could be a cause of generalized weakness.  Will continue his current medication of glipizide 10 mg by mouth twice a day.

## 2011-06-28 NOTE — Assessment & Plan Note (Signed)
Patient has a history of low TSH with normal free T4; therefore patient has been subclinical hyperthyroidism.  His current symptoms may be secondary to his thyroid problem. -Will check a TSH and free T4 levels today

## 2011-06-28 NOTE — Assessment & Plan Note (Addendum)
Poorly controlled. This is likely secondary to anxiety as patient is not feeling well today.  Will not change his blood pressure medication regimen today and will continue to monitor. -Continue Norvasc 10 mg by mouth daily -Continue Lotensin 40 mg by mouth daily -Continue Lasix 20 mg by mouth twice a day -Continue metoprolol 25 mg twice a day

## 2011-06-28 NOTE — Progress Notes (Signed)
HPI: [name] is an 77 yo man with PMH of DM type II, HTN, subclinical hyperthyroidism, bilateral leg edema presents today generalized weakness, tremor/cold intolerance, and polydypsia.   1. Patient has been feeling weakness all over in the past 3 months. He states that he feels so tired all day long and just wanted to sleep. He denies any loss of appetite, suicide ideation. He also denies any chest pain, shortness of breath, fevers, nausea or vomiting or abdominal pain. He attributes his weakness to old age.  He only sleep a few hours per night and reports Ambien does not make him feel good. His spirit is not that good and mildly depressed. 2. he also has been having tremors/feeling cold in the past 6 months which has progressively worsening in the past few weeks. He states that even in the hot weather he continues to feel cold and needs blankets to keep warm.   3. he also complains of feeling very thirsty and has been drinking a lot of water. He complains of polyuria because he started Lasix 20 mg in the morning and 20 mg at night which really helps decrease his edema. He denies any dysuria, burning sensation. As for his diabetes, patient has not checked his blood sugar at home. He is currently taking glipizide 10 mg twice a day.  His CBC today was in the 180s during office visit.  Review of systems: As per history of present illness  Physical examination: General: alert, well-developed, and cooperative to examination.  Lungs: normal respiratory effort, no accessory muscle use, normal breath sounds, no crackles, and no wheezes. Heart: Tachycardic, occasional PVCs, irregular pulse,no murmur, no gallop, and no rub.  Abdomen: soft, obese, non-tender, normal bowel sounds, + distention, no guarding, no rebound tenderness  Extremities: No cyanosis, clubbing, +3 pitting edema, erythematous, non-tender, no drainage or increased in warmth Neurologic: alert & oriented X3, cranial nerves II-XII intact,  strength normal in all extremities, sensation intact to light touch, and gait normal.  Skin: turgor normal and no rashes.  Psych: Oriented X3, memory intact for recent and remote, normally interactive, good eye contact, not anxious appearing, and not depressed appearing.

## 2011-06-28 NOTE — Assessment & Plan Note (Signed)
This is likely multifactorial given his age and his comorbidities. There is no evidence of infection as he is afebrile, no leukocytosis on CBC, and no obvious source of infection.  In addition, BMP did not shows any acute abnormalities.  His creatinine is at baseline.  Patient reports feeling depressed because he is not as active as he used to be.  Other differential diagnoses include vitamin B12 deficiency, vitamin D deficiency, and thyroid disease. -Will check TSH, free T4 -Will also check vitamin B12, vitamin D -Will start Zoloft 50 mg by mouth daily to help with his depression -Patient will followup with Dr. Aundria Rud in 2 weeks

## 2011-06-28 NOTE — Patient Instructions (Signed)
Return in 2 weeks to follow up with Dr. Aundria Rud Start taking Zoloft 50mg  one tablet daily to help with your depression

## 2011-06-30 ENCOUNTER — Other Ambulatory Visit: Payer: Self-pay | Admitting: Internal Medicine

## 2011-06-30 MED ORDER — CHOLECALCIFEROL 25 MCG (1000 UT) PO TABS: 1000.0000 [IU] | ORAL_TABLET | Freq: Every day | ORAL | Status: DC

## 2011-06-30 MED ORDER — VITAMIN D (ERGOCALCIFEROL) 1.25 MG (50000 UNIT) PO CAPS: ORAL_CAPSULE | ORAL | Status: DC

## 2011-07-05 NOTE — Progress Notes (Signed)
Pt aware.Jeremy Spittle Cassady8/7/20121:27 PM

## 2011-07-14 ENCOUNTER — Ambulatory Visit (INDEPENDENT_AMBULATORY_CARE_PROVIDER_SITE_OTHER): Payer: Medicare Other | Admitting: Internal Medicine

## 2011-07-14 ENCOUNTER — Encounter: Payer: Self-pay | Admitting: Internal Medicine

## 2011-07-14 DIAGNOSIS — E119 Type 2 diabetes mellitus without complications: Secondary | ICD-10-CM

## 2011-07-14 DIAGNOSIS — L97509 Non-pressure chronic ulcer of other part of unspecified foot with unspecified severity: Secondary | ICD-10-CM | POA: Insufficient documentation

## 2011-07-14 DIAGNOSIS — R609 Edema, unspecified: Secondary | ICD-10-CM

## 2011-07-14 LAB — GLUCOSE, CAPILLARY: Glucose-Capillary: 190 mg/dL — ABNORMAL HIGH (ref 70–99)

## 2011-07-14 MED ORDER — FUROSEMIDE 20 MG PO TABS: 40.0000 mg | ORAL_TABLET | Freq: Two times a day (BID) | ORAL | Status: DC

## 2011-07-14 MED ORDER — FUROSEMIDE 40 MG PO TABS: 40.0000 mg | ORAL_TABLET | Freq: Two times a day (BID) | ORAL | Status: DC

## 2011-07-14 NOTE — Assessment & Plan Note (Signed)
Large ulcer but not evidently infected today.  Major pathogenesis is massive edema.  Will try again to encourage elevation and 80 mg lasix (for openers).  Will send to [facility] for baseline advice.  Will see again in 2 weeks.

## 2011-07-14 NOTE — Progress Notes (Signed)
49 man seen 2 weeks ago for malaise and cold intolerance.  Considered as possibly depressed and started on zoloft -- which he did not tolerate and stopped after 1 dose. Also encouraged to increase lasix to 20 bid instead of qd.  He did this some days but has actually gained 4 lbs.  Despite this he feels better and is smiling today.  Unclear what is better but his non-specific malaise has converted to non-specific euphoria.  He has massive edema, basically unchanged over 2-3 years.  Today he shows me a 2 cm deep ulcer under left 1st toe.  No cellulitis or purulence.  No warmth or tenderness of toe or foot proximal to ulcer.  Insists his feeling is normal. No systemic sx.

## 2011-07-25 ENCOUNTER — Other Ambulatory Visit (HOSPITAL_BASED_OUTPATIENT_CLINIC_OR_DEPARTMENT_OTHER): Payer: Self-pay | Admitting: Internal Medicine

## 2011-07-25 ENCOUNTER — Encounter (HOSPITAL_BASED_OUTPATIENT_CLINIC_OR_DEPARTMENT_OTHER): Payer: Medicare Other | Attending: Internal Medicine

## 2011-07-25 DIAGNOSIS — L97509 Non-pressure chronic ulcer of other part of unspecified foot with unspecified severity: Secondary | ICD-10-CM | POA: Insufficient documentation

## 2011-07-25 DIAGNOSIS — Z79899 Other long term (current) drug therapy: Secondary | ICD-10-CM | POA: Insufficient documentation

## 2011-07-25 DIAGNOSIS — Z951 Presence of aortocoronary bypass graft: Secondary | ICD-10-CM | POA: Insufficient documentation

## 2011-07-25 DIAGNOSIS — I2510 Atherosclerotic heart disease of native coronary artery without angina pectoris: Secondary | ICD-10-CM | POA: Insufficient documentation

## 2011-07-25 DIAGNOSIS — I70209 Unspecified atherosclerosis of native arteries of extremities, unspecified extremity: Secondary | ICD-10-CM | POA: Insufficient documentation

## 2011-07-25 DIAGNOSIS — R609 Edema, unspecified: Secondary | ICD-10-CM | POA: Insufficient documentation

## 2011-07-25 DIAGNOSIS — Z7982 Long term (current) use of aspirin: Secondary | ICD-10-CM | POA: Insufficient documentation

## 2011-07-25 DIAGNOSIS — I10 Essential (primary) hypertension: Secondary | ICD-10-CM | POA: Insufficient documentation

## 2011-07-25 DIAGNOSIS — E1169 Type 2 diabetes mellitus with other specified complication: Secondary | ICD-10-CM | POA: Insufficient documentation

## 2011-07-25 LAB — GLUCOSE, CAPILLARY: Glucose-Capillary: 167 mg/dL — ABNORMAL HIGH (ref 70–99)

## 2011-07-26 NOTE — Progress Notes (Unsigned)
[facility]  Jeremy Sanders, Jeremy Sanders              ACCOUNT NO.:  [id number]  MEDICAL RECORD NO.:  [id number]      DATE OF BIRTH:  28-Nov-1925  PHYSICIAN:  Jonelle Sports. Nyara Capell, M.D.  VISIT DATE:  07/25/2011                                  OFFICE VISIT   HISTORY:  This 77 year old [place] born male is seen for an ulceration on his left great toe of approximately 3 weeks' duration.  The patient has history of coronary heart disease status post bypass, but has not been aware of ever been told he had a congestive heart failure.  He has had some degree of hypertension as well.  He tends to be chronically edematous in his lower extremities and had been on furosemide actually in pretty hefty doses which he shows for some reason to leave off 5 or 6 weeks ago.  That was fine until this foot swelling dramatically worsened and he wound up with a crack on the plantar aspect of the left hallux which rapidly advanced into an open ulcer.  This has gotten progressively larger as opposed to improving.  Since it was first noticed 3 weeks ago, he requested referral here for our evaluation and advice.  PAST MEDICAL HISTORY:  Operations include coronary artery bypass grafting, apparently only 1 vessel some 15 years ago.  He has had no other medical hospitalizations.  He is said to be free of any drug allergies.  His regular medications include: 1. Metoprolol 25 mg b.i.d. 2. Benazepril 40 mg b.i.d. 3. Amlodipine 5 mg daily. 4. Furosemide 40 mg t.i.d. 5. Baby aspirin daily. 6. Zocor 20 mg daily. 7. Glipizide 10 mg b.i.d. 8. Flomax 0.4 mg. 9. Omeprazole 20 mg every other day. 10.Ultracet 37.5/325 three times daily. 11.Zolpidem 5 mg daily. 12.Nitroglycerin 0.4 mg p.r.n.  FAMILY HISTORY:  Not reviewed in great detail but apparently there was never any family history of foot ulcers.  PERSONAL HISTORY:  The patient is a native of [place] and has been in this country for the last  62 years.  He is married to a [place] woman who is 5 years his senior and who is said to be in good health. They live together independently.  The patient is able to care for all of his personal needs.  He does use a cane to assist in his ambulation. He denies use of tobacco, any form of alcohol, or any illicit drugs.  He gets very limited physical activity.  REVIEW OF SYSTEMS:  The patient is hypertensive, says that has always been controlled and he has never had a stroke or other symptomatology. He is unaware of any evaluation of his peripheral vascular circulation nor has he been aware of being told that he has any problems with either the venous or arterial status in the lower extremities.  He has diabetes with some mild peripheral neuropathy, denies that it is ever affected his eyes or his kidneys.  He has never had cancer, never received radiation or chemotherapy.  He denies any known chronic lung disease. He has no history of peptic ulcer disease, GI blood loss, etc.  PHYSICAL EXAMINATION:  VITAL SIGNS:  Blood pressure is 135/74, pulse 67 and regular, respirations 18 and unlabored, temperature 98.5 degrees. Random blood glucose 167 mg/dL. GENERAL:  This is an alert, cooperative, somewhat obese, chronically ill- appearing elderly male, who is in no immediate distress at the moment. His feet are extremely edematous. HEENT:  His mucous membranes are moist and pink.  His dental repair is reasonable. NECK:  He has no carotid bruits.  He has no thyroid enlargement. HEART:  It is hard to assess its size.  Its tones are fair quality with regular rhythm.  No definite murmur or gallop that I can appreciate. LUNGS:  Grossly clear except for a few crackles at the bases posteriorly. ABDOMEN:  Protuberant but nontender without organomegaly or masses. EXTREMITIES:  His extremities are massively edematous, extending well above the knees bilaterally, making palpation of pulses impossible.   The handheld Doppler, however, does show a biphasic signal at the dorsalis pedis on the left and a biphasic but then continuous signal on the posterior tibial area.  On his left hallux which incidentally is a flail toe with toe drop.  On his distal tuft is an ulcer measuring 1.8 x 1.6 x 0.5 cm, having some callus rim around the margins, a reasonably clean base with limited amount of slough apparent.  He has no redness or erythema extending from the wound.  There is no significant drainage, no odor, and he says the wound is not painful.  IMPRESSION: 1. Diabetic foot ulcer, Wagner stage II, plantar aspect tuft of left     hallux. 2. Hypertensive and arteriosclerotic vascular disease with suspected     peripheral vascular disease as a partial basis for this illness.  DISPOSITION: 1. The wound is debrided.  Both the margins and the bases are pretty     easily accomplished with no surprises found.  The post debridement     dimensions are 2.0 x 1.8 x 0.5 cm.  There is no penetration to     bone.  No significant undermining.  No drainage and no odor.  Following this, the wound is dressed with an application of hydrogel and silver collagen and the toe was placed in the toe sock.  He was placed in a [facility] platform walker with some posting proximal to the wound as well in an effort to offload that toe.  The rationale for this is discussed with him in great detail.  He is worn about the instability that may well give him imbalance and is encouraged to use his cane at all times.  He is also advised that it may cause problems in other joints such as knees or hips and that he should let us know right away should that happen.  His followup visit will be here in 8 days with his not having been expected to make dressing changes at home in the interim.          ______________________________ Jonelle Sports Cheryll Cockayne, M.D.     RES/MEDQ  D:  07/25/2011  T:  07/26/2011  Job:  [id number]

## 2011-08-02 ENCOUNTER — Encounter (HOSPITAL_BASED_OUTPATIENT_CLINIC_OR_DEPARTMENT_OTHER): Payer: Medicare Other | Attending: Internal Medicine

## 2011-08-02 DIAGNOSIS — Z7982 Long term (current) use of aspirin: Secondary | ICD-10-CM | POA: Insufficient documentation

## 2011-08-02 DIAGNOSIS — I10 Essential (primary) hypertension: Secondary | ICD-10-CM | POA: Insufficient documentation

## 2011-08-02 DIAGNOSIS — I2510 Atherosclerotic heart disease of native coronary artery without angina pectoris: Secondary | ICD-10-CM | POA: Insufficient documentation

## 2011-08-02 DIAGNOSIS — R609 Edema, unspecified: Secondary | ICD-10-CM | POA: Insufficient documentation

## 2011-08-02 DIAGNOSIS — I70209 Unspecified atherosclerosis of native arteries of extremities, unspecified extremity: Secondary | ICD-10-CM | POA: Insufficient documentation

## 2011-08-02 DIAGNOSIS — Z79899 Other long term (current) drug therapy: Secondary | ICD-10-CM | POA: Insufficient documentation

## 2011-08-02 DIAGNOSIS — Z951 Presence of aortocoronary bypass graft: Secondary | ICD-10-CM | POA: Insufficient documentation

## 2011-08-02 DIAGNOSIS — L97509 Non-pressure chronic ulcer of other part of unspecified foot with unspecified severity: Secondary | ICD-10-CM | POA: Insufficient documentation

## 2011-08-02 DIAGNOSIS — E1169 Type 2 diabetes mellitus with other specified complication: Secondary | ICD-10-CM | POA: Insufficient documentation

## 2011-08-29 ENCOUNTER — Encounter (HOSPITAL_BASED_OUTPATIENT_CLINIC_OR_DEPARTMENT_OTHER): Payer: Medicare Other | Attending: Internal Medicine

## 2011-08-29 DIAGNOSIS — Z79899 Other long term (current) drug therapy: Secondary | ICD-10-CM | POA: Insufficient documentation

## 2011-08-29 DIAGNOSIS — E1169 Type 2 diabetes mellitus with other specified complication: Secondary | ICD-10-CM | POA: Insufficient documentation

## 2011-08-29 DIAGNOSIS — I10 Essential (primary) hypertension: Secondary | ICD-10-CM | POA: Insufficient documentation

## 2011-08-29 DIAGNOSIS — L97509 Non-pressure chronic ulcer of other part of unspecified foot with unspecified severity: Secondary | ICD-10-CM | POA: Insufficient documentation

## 2011-09-22 ENCOUNTER — Ambulatory Visit (INDEPENDENT_AMBULATORY_CARE_PROVIDER_SITE_OTHER): Payer: Medicare Other | Admitting: Cardiovascular Disease

## 2011-09-22 ENCOUNTER — Encounter: Payer: Self-pay | Admitting: Cardiovascular Disease

## 2011-09-22 VITALS — BP 136/74 | HR 76 | Ht 69.0 in | Wt 230.8 lb

## 2011-09-22 DIAGNOSIS — E785 Hyperlipidemia, unspecified: Secondary | ICD-10-CM

## 2011-09-22 DIAGNOSIS — I2510 Atherosclerotic heart disease of native coronary artery without angina pectoris: Secondary | ICD-10-CM

## 2011-09-22 MED ORDER — METOPROLOL TARTRATE 25 MG PO TABS: 25.0000 mg | ORAL_TABLET | Freq: Two times a day (BID) | ORAL | Status: DC

## 2011-09-22 NOTE — Patient Instructions (Signed)
Your physician wants you to follow-up in: 12 months.  You will receive a reminder letter in the mail two months in advance. If you don't receive a letter, please call our office to schedule the follow-up appointment.  Your physician recommends that you continue on your current medications as directed. Please refer to the Current Medication list given to you today.   

## 2011-09-22 NOTE — Assessment & Plan Note (Signed)
He is on a statin. Lipids October 2011 with LDL of 75. Lipids followed in primary care.

## 2011-09-22 NOTE — Assessment & Plan Note (Signed)
Stable No changes 

## 2011-09-22 NOTE — Progress Notes (Signed)
History of Present Illness:77 yo WM with history of CAD, HTN, HLD, DM, CRI, venous insufficiency here today for cardiac follow up. He has been followed in the past by Dr. Juanda Chance. He had bypass surgery in 1997. He underwent cardiac catheterization in 2004 and his LIMA graft was okay and the saphenous vein graft to the circumflex artery and PL branch was patent. The report indicates that a Ramus branch also filled from this graft. There was severe distal disease in the LAD beyond the graft insertion and in the mid LAD proximal to the graft insertion. He also has chronic renal insufficiency with creatinines in the range of 1.8.  He's had no chest pain, SOB or palpitations. He has difficulty walking and more trouble with his balance and weakness. He has a healing ulcer on his left great toe and he tells me that it is almost completely healed. He has been following in the wound clinic at [facility].   His primary care physician is Ulyess Mort. Lipids are followed by Dr. Aundria Rud.     Past Medical History  Diagnosis Date  . Diabetes mellitus   . Hypertension   . Hyperlipidemia   . Cholelithiasis     Dr. Matthias Hughs  . CAD (coronary artery disease)     s/p CABG in 1997. Dr. Juanda Chance.  . Venous insufficiency of leg   . BPH (benign prostatic hyperplasia)     Dr. Isabel Caprice  . Eosinophilia 2005    Since 2005  . Diverticulosis 2005    GI bleed 02/2004  . Insomnia   . Chronic kidney disease     Cr around 2 since 2005  . Hyperthyroidism     persistently low TSH. RAI treatement in 2002. Reffered to Dr. Ardyth Harps in 2007.  Marland Kitchen Proteinuria 2007    Past Surgical History  Procedure Date  . Coronary artery bypass graft     1997    Current Outpatient Prescriptions  Medication Sig Dispense Refill  . ambrisentan (LETAIRIS) 5 MG tablet Take 5 mg by mouth daily.        Marland Kitchen amLODipine (NORVASC) 10 MG tablet Take 10 mg by mouth daily.        Marland Kitchen aspirin 81 MG tablet Take 81 mg by mouth daily.        . benazepril  (LOTENSIN) 40 MG tablet Take 40 mg by mouth 2 (two) times daily.        . Cholecalciferol 1000 UNITS tablet Take 1 tablet (1,000 Units total) by mouth daily.  30 tablet  11  . furosemide (LASIX) 40 MG tablet Take 1 tablet (40 mg total) by mouth 2 (two) times daily.  60 tablet  6  . glipiZIDE (GLUCOTROL) 10 MG tablet Take 10 mg by mouth 2 (two) times daily before a meal.        . metoprolol (LOPRESSOR) 25 MG tablet Take 25 mg by mouth 2 (two) times daily.        . nitroGLYCERIN (NITROSTAT) 0.4 MG SL tablet Place 1 tablet under tongue for chest pain, may repeat in 5 minutes.  If pain persists another 5 minutes, take a third tablet and call 911.       . omeprazole (PRILOSEC) 20 MG capsule Take 20 mg by mouth every other day.       . simvastatin (ZOCOR) 20 MG tablet Take 20 mg by mouth at bedtime.        . Tamsulosin HCl (FLOMAX) 0.4 MG CAPS Take 0.4 mg by  mouth daily.        Marland Kitchen tramadol-acetaminophen (ULTRACET) 37.5-325 MG per tablet Take 1 tablet by mouth every 6 (six) hours as needed.        . Vitamin D, Ergocalciferol, (DRISDOL) 50000 UNITS CAPS Take one capsule of 50,000 once per week x 6 weeks, then take 1000 units daily thereafter  6 capsule  0    No Known Allergies  History   Social History  . Marital Status: Married    Spouse Name: N/A    Number of Children: N/A  . Years of Education: N/A   Occupational History  . Not on file.   Social History Main Topics  . Smoking status: Former Games developer  . Smokeless tobacco: Not on file  . Alcohol Use: Not on file  . Drug Use: Not on file  . Sexually Active: Not on file   Other Topics Concern  . Not on file   Social History Narrative   Originally from [place]. Has been a world traveler. Unclear about his career by appears its a comfortable retirement with his wife in [place].     No family history on file.  Review of Systems:  As stated in the HPI and otherwise negative.   BP 136/74  Pulse 76  Ht 5\' 9"  (1.753 m)  Wt 230 lb 12.8  oz (104.69 kg)  BMI 34.08 kg/m2  Physical Examination: General: Well developed, well nourished, NAD HEENT: OP clear, mucus membranes moist SKIN: warm, dry. No rashes. Neuro: No focal deficits Musculoskeletal: Muscle strength 5/5 all ext Psychiatric: Mood and affect normal Neck: No JVD, no carotid bruits, no thyromegaly, no lymphadenopathy. Lungs:Clear bilaterally, no wheezes, rhonci, crackles Cardiovascular: Regular rate and rhythm. No murmurs, gallops or rubs. Abdomen:Soft. Bowel sounds present. Non-tender.  Extremities: 2+ bilateral lower ext edema. Venous stasis changes. Pulses are difficult to palpate secondary to edema.   JYN:WGNFA rhythm, rate 76 bpm. PACs.

## 2011-10-03 ENCOUNTER — Encounter (HOSPITAL_BASED_OUTPATIENT_CLINIC_OR_DEPARTMENT_OTHER): Payer: Medicare Other | Attending: Internal Medicine

## 2011-10-03 DIAGNOSIS — E1169 Type 2 diabetes mellitus with other specified complication: Secondary | ICD-10-CM | POA: Insufficient documentation

## 2011-10-03 DIAGNOSIS — I10 Essential (primary) hypertension: Secondary | ICD-10-CM | POA: Insufficient documentation

## 2011-10-03 DIAGNOSIS — Z79899 Other long term (current) drug therapy: Secondary | ICD-10-CM | POA: Insufficient documentation

## 2011-10-03 DIAGNOSIS — L97509 Non-pressure chronic ulcer of other part of unspecified foot with unspecified severity: Secondary | ICD-10-CM | POA: Insufficient documentation

## 2011-11-07 ENCOUNTER — Encounter (HOSPITAL_BASED_OUTPATIENT_CLINIC_OR_DEPARTMENT_OTHER): Payer: Medicare Other | Attending: Internal Medicine

## 2011-11-07 DIAGNOSIS — L97509 Non-pressure chronic ulcer of other part of unspecified foot with unspecified severity: Secondary | ICD-10-CM | POA: Insufficient documentation

## 2011-11-07 DIAGNOSIS — E1169 Type 2 diabetes mellitus with other specified complication: Secondary | ICD-10-CM | POA: Insufficient documentation

## 2011-11-11 ENCOUNTER — Encounter: Payer: Self-pay | Admitting: Internal Medicine

## 2011-11-11 ENCOUNTER — Ambulatory Visit (INDEPENDENT_AMBULATORY_CARE_PROVIDER_SITE_OTHER): Payer: Medicare Other | Admitting: Internal Medicine

## 2011-11-11 VITALS — BP 157/79 | HR 58 | Ht 69.0 in | Wt 237.9 lb

## 2011-11-11 DIAGNOSIS — E119 Type 2 diabetes mellitus without complications: Secondary | ICD-10-CM

## 2011-11-11 DIAGNOSIS — F329 Major depressive disorder, single episode, unspecified: Secondary | ICD-10-CM | POA: Insufficient documentation

## 2011-11-11 DIAGNOSIS — I10 Essential (primary) hypertension: Secondary | ICD-10-CM

## 2011-11-11 DIAGNOSIS — Z23 Encounter for immunization: Secondary | ICD-10-CM

## 2011-11-11 LAB — COMPREHENSIVE METABOLIC PANEL
Albumin: 3.8 g/dL (ref 3.5–5.2)
Alkaline Phosphatase: 83 U/L (ref 39–117)
BUN: 33 mg/dL — ABNORMAL HIGH (ref 6–23)
CO2: 24 mEq/L (ref 19–32)
Glucose, Bld: 149 mg/dL — ABNORMAL HIGH (ref 70–99)
Potassium: 4.5 mEq/L (ref 3.5–5.3)
Sodium: 142 mEq/L (ref 135–145)
Total Bilirubin: 0.4 mg/dL (ref 0.3–1.2)
Total Protein: 6.5 g/dL (ref 6.0–8.3)

## 2011-11-11 MED ORDER — TAMSULOSIN HCL 0.4 MG PO CAPS: 0.4000 mg | ORAL_CAPSULE | Freq: Every day | ORAL | Status: DC

## 2011-11-11 MED ORDER — AMLODIPINE BESYLATE 10 MG PO TABS: 10.0000 mg | ORAL_TABLET | Freq: Every day | ORAL | Status: DC

## 2011-11-11 MED ORDER — GLIPIZIDE 10 MG PO TABS: 10.0000 mg | ORAL_TABLET | Freq: Two times a day (BID) | ORAL | Status: DC

## 2011-11-11 MED ORDER — PAROXETINE HCL 20 MG PO TABS: 20.0000 mg | ORAL_TABLET | Freq: Every day | ORAL | Status: DC

## 2011-11-11 MED ORDER — SIMVASTATIN 20 MG PO TABS: 20.0000 mg | ORAL_TABLET | Freq: Every day | ORAL | Status: DC

## 2011-11-11 NOTE — Progress Notes (Signed)
40 man with DM, CAD, massive lymphedema, and insomnia.  He has just finished several visits at the wound center for an ulcer on his toe which has resolved. The wound clinic advised a referral to lymphedema clinic.  This apparantly has a waiting list, would likely involve many visits, considerable process and high cost. UpToDate suggests prolonged programs of massage and compression probably helps some but some studies are equivocal.  He denies much discomfort and is generally averse to cost and bother.

## 2011-11-11 NOTE — Assessment & Plan Note (Addendum)
Usually has mild systolic HTN as today.  On lasix 80, amlodipine 10, benazepril 40, and metoprolol 25 bid. Denies chest pain or orthopnea.  Doubtwe can do much better with this.

## 2011-11-11 NOTE — Assessment & Plan Note (Signed)
A1c is 7.7.  Only on glipizide.  Wgt is unchanged from August. Has nephrotic proteinuria and creat of 2.3.  Cannot take metformin. Lipids consistently: LDL 75 and HDL 65. On zocor.

## 2011-11-11 NOTE — Patient Instructions (Signed)
Take new depression pill every day unless it causes a problem.

## 2011-12-12 ENCOUNTER — Encounter (HOSPITAL_BASED_OUTPATIENT_CLINIC_OR_DEPARTMENT_OTHER): Payer: Medicare Other | Attending: Internal Medicine

## 2011-12-12 DIAGNOSIS — L97509 Non-pressure chronic ulcer of other part of unspecified foot with unspecified severity: Secondary | ICD-10-CM | POA: Insufficient documentation

## 2011-12-12 DIAGNOSIS — E1169 Type 2 diabetes mellitus with other specified complication: Secondary | ICD-10-CM | POA: Insufficient documentation

## 2011-12-23 ENCOUNTER — Encounter: Payer: Medicare Other | Admitting: Internal Medicine

## 2011-12-30 ENCOUNTER — Telehealth: Payer: Self-pay | Admitting: *Deleted

## 2011-12-30 NOTE — Telephone Encounter (Signed)
Patient called to report 2-3 falls over 36 hours.  He believes his knees are weak.  On close questioning, he may have developed specifically-left leg weakness leading to these falls.  Says both arms are normal and no vision changes.  He does feel faint.  Advised him to call 911 and come to ER for eval and head CT.  Called Lars Mage, today's admitting resident to be aware of him.  May need admission.

## 2011-12-30 NOTE — Telephone Encounter (Signed)
Call from pt stating he fell a couple of times and had to call EMS to help get him up.  Pt is now requesting an appointment with DrRogers.  Pt was advised to go to ED, but really wanted to see Dr Aundria Rud.  Appt made for 01/03/12 at 2pm.  Dr Aundria Rud calling pt for more information regarding his fall.Jeremy Spittle Cassady2/1/20133:56 PM

## 2012-01-01 ENCOUNTER — Encounter (HOSPITAL_COMMUNITY): Payer: Self-pay | Admitting: Emergency Medicine

## 2012-01-01 ENCOUNTER — Emergency Department (HOSPITAL_COMMUNITY): Payer: Medicare Other

## 2012-01-01 ENCOUNTER — Inpatient Hospital Stay (HOSPITAL_COMMUNITY)
Admission: EM | Admit: 2012-01-01 | Discharge: 2012-01-04 | DRG: 065 | Disposition: A | Discharge status: Home Health | Payer: Medicare Other | Source: Ambulatory Visit | Attending: Internal Medicine | Admitting: Internal Medicine

## 2012-01-01 ENCOUNTER — Other Ambulatory Visit: Payer: Self-pay

## 2012-01-01 DIAGNOSIS — Z79899 Other long term (current) drug therapy: Secondary | ICD-10-CM

## 2012-01-01 DIAGNOSIS — N400 Enlarged prostate without lower urinary tract symptoms: Secondary | ICD-10-CM | POA: Diagnosis present

## 2012-01-01 DIAGNOSIS — I2510 Atherosclerotic heart disease of native coronary artery without angina pectoris: Secondary | ICD-10-CM | POA: Insufficient documentation

## 2012-01-01 DIAGNOSIS — N182 Chronic kidney disease, stage 2 (mild): Secondary | ICD-10-CM | POA: Diagnosis present

## 2012-01-01 DIAGNOSIS — Z8673 Personal history of transient ischemic attack (TIA), and cerebral infarction without residual deficits: Secondary | ICD-10-CM | POA: Diagnosis present

## 2012-01-01 DIAGNOSIS — E0590 Thyrotoxicosis, unspecified without thyrotoxic crisis or storm: Secondary | ICD-10-CM | POA: Diagnosis present

## 2012-01-01 DIAGNOSIS — Z8639 Personal history of other endocrine, nutritional and metabolic disease: Secondary | ICD-10-CM | POA: Insufficient documentation

## 2012-01-01 DIAGNOSIS — Z9181 History of falling: Secondary | ICD-10-CM

## 2012-01-01 DIAGNOSIS — Z6832 Body mass index (BMI) 32.0-32.9, adult: Secondary | ICD-10-CM

## 2012-01-01 DIAGNOSIS — I10 Essential (primary) hypertension: Secondary | ICD-10-CM | POA: Insufficient documentation

## 2012-01-01 DIAGNOSIS — K8020 Calculus of gallbladder without cholecystitis without obstruction: Secondary | ICD-10-CM | POA: Diagnosis present

## 2012-01-01 DIAGNOSIS — W19XXXA Unspecified fall, initial encounter: Secondary | ICD-10-CM

## 2012-01-01 DIAGNOSIS — E119 Type 2 diabetes mellitus without complications: Secondary | ICD-10-CM | POA: Diagnosis present

## 2012-01-01 DIAGNOSIS — R531 Weakness: Secondary | ICD-10-CM

## 2012-01-01 DIAGNOSIS — E113299 Type 2 diabetes mellitus with mild nonproliferative diabetic retinopathy without macular edema, unspecified eye: Secondary | ICD-10-CM | POA: Insufficient documentation

## 2012-01-01 DIAGNOSIS — Z7982 Long term (current) use of aspirin: Secondary | ICD-10-CM

## 2012-01-01 DIAGNOSIS — E785 Hyperlipidemia, unspecified: Secondary | ICD-10-CM | POA: Insufficient documentation

## 2012-01-01 DIAGNOSIS — I890 Lymphedema, not elsewhere classified: Secondary | ICD-10-CM | POA: Diagnosis present

## 2012-01-01 DIAGNOSIS — Z9861 Coronary angioplasty status: Secondary | ICD-10-CM

## 2012-01-01 DIAGNOSIS — Z87891 Personal history of nicotine dependence: Secondary | ICD-10-CM

## 2012-01-01 DIAGNOSIS — I6350 Cerebral infarction due to unspecified occlusion or stenosis of unspecified cerebral artery: Principal | ICD-10-CM | POA: Diagnosis present

## 2012-01-01 DIAGNOSIS — N179 Acute kidney failure, unspecified: Secondary | ICD-10-CM | POA: Diagnosis not present

## 2012-01-01 DIAGNOSIS — Z7902 Long term (current) use of antithrombotics/antiplatelets: Secondary | ICD-10-CM

## 2012-01-01 DIAGNOSIS — I129 Hypertensive chronic kidney disease with stage 1 through stage 4 chronic kidney disease, or unspecified chronic kidney disease: Secondary | ICD-10-CM | POA: Diagnosis present

## 2012-01-01 DIAGNOSIS — R269 Unspecified abnormalities of gait and mobility: Secondary | ICD-10-CM

## 2012-01-01 DIAGNOSIS — R42 Dizziness and giddiness: Secondary | ICD-10-CM

## 2012-01-01 DIAGNOSIS — I4891 Unspecified atrial fibrillation: Secondary | ICD-10-CM | POA: Diagnosis present

## 2012-01-01 DIAGNOSIS — I872 Venous insufficiency (chronic) (peripheral): Secondary | ICD-10-CM | POA: Diagnosis present

## 2012-01-01 DIAGNOSIS — I639 Cerebral infarction, unspecified: Secondary | ICD-10-CM

## 2012-01-01 LAB — CBC
MCH: 29.6 pg (ref 26.0–34.0)
MCV: 90.4 fL (ref 78.0–100.0)
Platelets: 193 10*3/uL (ref 150–400)
RDW: 13.4 % (ref 11.5–15.5)

## 2012-01-01 LAB — LIPID PANEL
Cholesterol: 111 mg/dL (ref 0–200)
LDL Cholesterol: 28 mg/dL (ref 0–99)
Total CHOL/HDL Ratio: 1.7 RATIO
Triglycerides: 97 mg/dL (ref <150)
VLDL: 19 mg/dL (ref 0–40)

## 2012-01-01 LAB — URINALYSIS, MICROSCOPIC ONLY
Glucose, UA: 100 mg/dL — AB
Leukocytes, UA: NEGATIVE
Protein, ur: >300 mg/dL — AB

## 2012-01-01 LAB — COMPREHENSIVE METABOLIC PANEL
ALT: 10 U/L (ref 0–53)
AST: 18 U/L (ref 0–37)
Calcium: 8.9 mg/dL (ref 8.4–10.5)
GFR calc Af Amer: 34 mL/min — ABNORMAL LOW (ref >90)
Sodium: 141 mEq/L (ref 135–145)
Total Protein: 6.6 g/dL (ref 6.0–8.3)

## 2012-01-01 LAB — CARDIAC PANEL(CRET KIN+CKTOT+MB+TROPI): Total CK: 168 U/L (ref 7–232)

## 2012-01-01 LAB — DIFFERENTIAL
Basophils Absolute: 0.1 10*3/uL (ref 0.0–0.1)
Basophils Relative: 1 % (ref 0–1)
Eosinophils Absolute: 1 10*3/uL — ABNORMAL HIGH (ref 0.0–0.7)
Eosinophils Relative: 15 % — ABNORMAL HIGH (ref 0–5)

## 2012-01-01 MED ORDER — CLOPIDOGREL BISULFATE 75 MG PO TABS
75.0000 mg | ORAL_TABLET | Freq: Every day | ORAL | Status: DC
  Administered 2012-01-02 – 2012-01-04 (×3): 75 mg via ORAL
  Filled 2012-01-01 (×5): qty 1

## 2012-01-01 MED ORDER — FUROSEMIDE 40 MG PO TABS
40.0000 mg | ORAL_TABLET | Freq: Two times a day (BID) | ORAL | Status: DC
  Administered 2012-01-01 – 2012-01-02 (×3): 40 mg via ORAL
  Filled 2012-01-01 (×6): qty 1

## 2012-01-01 MED ORDER — PANTOPRAZOLE SODIUM 40 MG PO TBEC
40.0000 mg | DELAYED_RELEASE_TABLET | Freq: Every day | ORAL | Status: DC
  Administered 2012-01-01 – 2012-01-03 (×3): 40 mg via ORAL
  Filled 2012-01-01 (×3): qty 1

## 2012-01-01 MED ORDER — SIMVASTATIN 20 MG PO TABS
20.0000 mg | ORAL_TABLET | Freq: Every day | ORAL | Status: DC
  Administered 2012-01-01 – 2012-01-03 (×3): 20 mg via ORAL
  Filled 2012-01-01 (×4): qty 1

## 2012-01-01 MED ORDER — ENOXAPARIN SODIUM 30 MG/0.3ML ~~LOC~~ SOLN
30.0000 mg | SUBCUTANEOUS | Status: DC
  Administered 2012-01-01 – 2012-01-03 (×3): 30 mg via SUBCUTANEOUS
  Filled 2012-01-01 (×4): qty 0.3

## 2012-01-01 MED ORDER — NITROGLYCERIN 0.4 MG SL SUBL: 0.4000 mg | SUBLINGUAL_TABLET | SUBLINGUAL | Status: DC | PRN

## 2012-01-01 MED ORDER — METOPROLOL TARTRATE 25 MG PO TABS
25.0000 mg | ORAL_TABLET | Freq: Two times a day (BID) | ORAL | Status: DC
  Administered 2012-01-01 – 2012-01-02 (×2): 25 mg via ORAL
  Filled 2012-01-01 (×3): qty 1

## 2012-01-01 MED ORDER — ZOLPIDEM TARTRATE 5 MG PO TABS: 10.0000 mg | ORAL_TABLET | Freq: Every evening | ORAL | Status: DC | PRN

## 2012-01-01 MED ORDER — SODIUM CHLORIDE 0.9 % IV SOLN: INTRAVENOUS | Status: DC

## 2012-01-01 MED ORDER — ZOLPIDEM TARTRATE 5 MG PO TABS: 5.0000 mg | ORAL_TABLET | Freq: Every evening | ORAL | Status: DC | PRN

## 2012-01-01 MED ORDER — TAMSULOSIN HCL 0.4 MG PO CAPS
0.4000 mg | ORAL_CAPSULE | Freq: Every day | ORAL | Status: DC
  Administered 2012-01-01 – 2012-01-04 (×4): 0.4 mg via ORAL
  Filled 2012-01-01 (×4): qty 1

## 2012-01-01 NOTE — Progress Notes (Signed)
Patient received in room 6735. Oriented to room and call light system. Call light within reach. Patient wife at bedside. Patient instructed to call for assistance for getting up and as needed.

## 2012-01-01 NOTE — H&P (Signed)
Hospital Admission Note Date: 01/01/2012  Patient name: Jeremy Sanders Medical record number: [id number] Date of birth: 03-Dec-1924 Age: 78 y.o. Gender: male PCP: Ulyess Mort, MD, MD  Medical Service: Internal Med  Attending physician:  Drue Second   Pager: Resident (R2/R3):  Aleen Campi Resident (R1):  [place]  Pager: [phone]  Chief Complaint: Falls, weakness  History of Present Illness: Patient is a very pleasant 78 year old man with history of diabetes, hypertension, hyperlipidemia, CAD who presents with 2 weeks of leg weakness, dizziness, and two falls. Patient says that he does not know what changed, but that recently he has felt as though his legs do not work well.  He started using a cane, which she never used to use before.  In terms of his dizziness, he says that he feels lightheaded when walking, but denies the room spinning around him. He has fallen twice, and his head gently each time. He says he feels dizzy when he gets up. Prior to 2 weeks ago he had never fallen. He denies any headache, chest pain, shortness of breath, palpitations, numbness/tingling/weakness in any limbs other than weakness in his legs. He denies melena or bright red blood per rectum. He does not check his blood sugar at home.  Past Medical History: Past Medical History  Diagnosis Date  . Diabetes mellitus   . Hypertension   . Hyperlipidemia   . Cholelithiasis     Dr. Matthias Hughs  . CAD (coronary artery disease)     s/p CABG in 1997. Dr. Juanda Chance.  . Venous insufficiency of leg   . BPH (benign prostatic hyperplasia)     Dr. Isabel Caprice  . Eosinophilia 2005    Since 2005  . Diverticulosis 2005    GI bleed 02/2004  . Insomnia   . Chronic kidney disease     Cr around 2 since 2005  . Hyperthyroidism     persistently low TSH. RAI treatement in 2002. Reffered to Dr. Ardyth Harps in 2007.  Marland Kitchen Proteinuria 2007   Past Surgical History  Procedure Date  . Coronary artery bypass graft     1997   Meds: Current  Facility-Administered Medications  Medication Dose Route Frequency Provider Last Rate Last Dose  . 0.9 %  sodium chloride infusion   Intravenous STAT Glynn Octave, MD       Medications Prior to Admission  Medication Dose Route Frequency Provider Last Rate Last Dose  . 0.9 %  sodium chloride infusion   Intravenous STAT Glynn Octave, MD       Medications Prior to Admission  Medication Sig Dispense Refill  . aspirin 81 MG tablet Take 81 mg by mouth daily.        . benazepril (LOTENSIN) 40 MG tablet Take 40 mg by mouth 2 (two) times daily.       . furosemide (LASIX) 40 MG tablet Take 40 mg by mouth 2 (two) times daily.      Marland Kitchen glipiZIDE (GLUCOTROL) 10 MG tablet Take 10 mg by mouth 2 (two) times daily before a meal.      . metoprolol tartrate (LOPRESSOR) 25 MG tablet Take 25 mg by mouth 2 (two) times daily.      . nitroGLYCERIN (NITROSTAT) 0.4 MG SL tablet Place 0.4 mg under the tongue every 5 (five) minutes as needed. For chest pain      . omeprazole (PRILOSEC) 20 MG capsule Take 20 mg by mouth every other day.       . simvastatin (ZOCOR) 20 MG tablet  Take 1 tablet (20 mg total) by mouth at bedtime. .  100 tablet  3  . Tamsulosin HCl (FLOMAX) 0.4 MG CAPS Take 0.4 mg by mouth daily.      Marland Kitchen tramadol-acetaminophen (ULTRACET) 37.5-325 MG per tablet Take 1 tablet by mouth every 6 (six) hours as needed. For pain        Allergies: Review of patient's allergies indicates no known allergies.  Family Hx: No family history on file.  Social Hx: History   Social History  . Marital Status: Married    Spouse Name: N/A    Number of Children: N/A  . Years of Education: N/A   Occupational History  . Not on file.   Social History Main Topics  . Smoking status: Former Games developer  . Smokeless tobacco: Not on file  . Alcohol Use: Not on file  . Drug Use: Not on file  . Sexually Active: Not on file   Other Topics Concern  . Not on file   Social History Narrative   Originally from [place]. Has  been a world traveler. Unclear about his career by appears its a comfortable retirement with his wife in [place].     Review of Systems: Indigestion, no longer taking ambien  Physical Exam: Filed Vitals:   01/01/12 1031 01/01/12 1122 01/01/12 1248 01/01/12 1351  BP: 136/70 140/72 169/91 180/75  Pulse: 77 72 60 61  Temp:    97.5 F (36.4 C)  TempSrc:    Oral  Resp:   20 20  Height:    5\' 9"  (1.753 m)  Weight:    225 lb 12 oz (102.4 kg)  SpO2:  94% 97% 96%   GEN: No apparent distress.  Alert and oriented x 3.  Pleasant, conversant, and cooperative to exam. HEENT: head is autraumatic and normocephalic.  Neck is supple without palpable masses or lymphadenopathy. Vision intact.  EOMI.  PERRLA.  Sclerae anicteric.  Conjunctivae without pallor or injection. Mucous membranes are moist.  Poor dentition. RESP:  Lungs are clear to ascultation bilaterally with good air movement.  No wheezes, ronchi, or rubs. CARDIOVASCULAR: regular rate, normal rhythm.  Clear S1, S2, no murmurs, gallops, or rubs. ABDOMEN: soft, non-tender, non-distended.  Bowels sounds present in all quadrants and normoactive.  No palpable masses. EXT: warm and dry.  Significant edema in b/l LE SKIN: warm and dry with normal turgor.  No rashes or abnormal lesions observed. NEURO: CN II-XII grossly intact.  Muscle strength +5/5 in bilateral upper and lower extremities.  Sensation is grossly intact.  No focal deficit. FTN intact, fast finger motions intact, unable to assess gait 2/2 dizziness.  Lab results: Basic Metabolic Panel:  [facility] 01/01/12 0937  NA 141  K 4.3  CL 109  CO2 22  GLUCOSE 182*  BUN 28*  CREATININE 1.99*  CALCIUM 8.9  MG --  PHOS --   Liver Function Tests:  [facility] 01/01/12 0937  AST 18  ALT 10  ALKPHOS 71  BILITOT 0.5  PROT 6.6  ALBUMIN 3.0*   CBC:  [facility] 01/01/12 0937  WBC 7.1  NEUTROABS 3.6  HGB 13.6  HCT 41.6  MCV 90.4  PLT 193   Cardiac Enzymes:  [facility] 01/01/12  0937  CKTOTAL 168  CKMB 4.8*  CKMBINDEX --  TROPONINI <0.30   Coagulation:  [facility]ename 01/01/12 0937  LABPROT 13.8  INR 1.04   Urinalysis:  [facility] 01/01/12 1047  COLORURINE YELLOW  LABSPEC 1.016  PHURINE 5.5  GLUCOSEU 100*  HGBUR SMALL*  BILIRUBINUR NEGATIVE  KETONESUR NEGATIVE  PROTEINUR >300*  UROBILINOGEN 0.2  NITRITE NEGATIVE  LEUKOCYTESUR NEGATIVE    Imaging results:  Dg Chest 2 View  01/01/2012  *RADIOLOGY REPORT*  Clinical Data: Dizziness  CHEST - 2 VIEW  Comparison: 03/11/2004  Findings: Evidence of CABG noted.  Mild enlargement cardiomediastinal silhouette is noted.  No pleural effusion.  Mild flattening of hemidiaphragms is noted which may be seen with COPD but is nonspecific.  No compression deformity.  IMPRESSION: Cardiomegaly without focal acute finding.  Original Report Authenticated By: Harrel Lemon, M.D.   Ct Head Wo Contrast  01/01/2012  *RADIOLOGY REPORT*  Clinical Data: Weakness and dizziness  CT HEAD WITHOUT CONTRAST  Technique:  Contiguous axial images were obtained from the base of the skull through the vertex without contrast.  Comparison: None.  Findings: Remote bilateral external capsule lacunar infarcts are noted. Cortical volume loss noted with proportional ventricular prominence.  Periventricular white matter hypodensity likely indicates small vessel ischemic change.  No acute hemorrhage, acute infarction, or mass lesion is identified.  No midline shift. Orbits and paranasal sinuses demonstrate mild ethmoid mucoperiosteal thickening but are otherwise unremarkable.  No skull fracture.  IMPRESSION: Mild ethmoid sinusitis.  No acute intracranial finding.  Chronic findings as above.  Original Report Authenticated By: Harrel Lemon, M.D.    Mr Brain Wo Contrast  01/01/2012  *RADIOLOGY REPORT*  Clinical Data:  Dizziness.  Weakness.  Recent falls.  MRI HEAD WITHOUT CONTRAST MRA HEAD WITHOUT CONTRAST  Technique:  Multiplanar, multiecho pulse sequences of  the brain and surrounding structures were obtained without intravenous contrast. Angiographic images of the head were obtained using MRA technique without contrast.  Comparison:  CT head without contrast 01/01/2012.   MRI HEAD   IMPRESSION:  1.  Acute non hemorrhagic infarcts of the left mid brain. 2.  Remote hemorrhagic infarct of the right cerebellum. 3.  Multiple remote lacunar infarcts of the basal ganglia and cerebellum bilaterally. 4.  Advanced atrophy and extensive white matter disease likely reflects the sequelae of chronic microvascular ischemia. 5.  Mild sinus disease as described. 6.  Right mastoid effusion.  No obstructing nasopharyngeal lesion is evident.   MRA HEAD    IMPRESSION:  1.  Moderate small vessel disease, most prominently in the posterior circulation. 2.  Mild irregularity within the cavernous carotid arteries and proximal basilar artery without significant stenosis relative to the distal vessels.  These results were called by telephone on 01/01/2012  at  12:30 pm to  Dr. Hughes Better, who verbally acknowledged these results.  Original Report Authenticated By: Jamesetta Orleans. MATTERN, M.D.    Other results: EKG: a fib at 62 with 1 PVC observed, no ST changes  Assessment & Plan by Problem: # CVA (cerebral infarction) Pt p/w 2 weeks leg weakness, dizziness, and 2 falls.  No obvious focal neurologic signs, but MRI shows acute infarct in the L midbrain and multiple prior infarcts in the b/l basal ganglia and cerebellum.  TPA not considered since we do not know the exact duration of the infarct, but the pt has had new sx for two weeks.  Balance issues likely 2/2 this recent CVA and accumulation of prior insults.  No acute bleed from falls.  Pt has risk factors including DM, HTN, and HL.  EKG indicates A-fib, which would be new dx for pt.  Seems very compliant with meds.  Will need to consider anticoagulation. - no TPA because of unknown window - carotid doppler - 2-D  echo - repeat  EKG in a.m. - telemetry - NPO until clears bedside swallow eval - plavix - con't HTN control - PT/OT consults - consider anticoagulation, but has been falling a lot - consider neurology input, though secondary risk modification is key at this point  # HTN Pt was on multiple antihypertensives and has been c/o orthostatic sx, though no orthostatic in ED.  Pressure now creeping up since arrival to ED. - con't lasix 40mg  bid - cont' lopressor 25mg  bid - hold norvasc - hold benazepril  # Diabetes, type II Last a1c of 7.7, on glipizide only. - SSI while in hospital  # Hyperlipidemia - lipid panel - con't zocor  # CAD No CP, palp, SOB.  Was on ASA 81mg .  Will switch to plavix  #Lymphedema Chronic problem, will cont lasix.  Has been improving over the past few months.  # HYPERTHYROIDISM H/o hyperthyroidism - TSH  #Ppx - lovenox - protonix

## 2012-01-01 NOTE — Progress Notes (Signed)
Patient has medications that wife will take home with her. RN reviewed medications with patient that he will receive this shift. Patient and wife verbalized understanding.

## 2012-01-01 NOTE — ED Provider Notes (Signed)
History     CSN: [id number]  Arrival date & time 01/01/12  0914   First MD Initiated Contact with Patient 01/01/12 [phone]      Chief Complaint  Patient presents with  . Weakness  . Dizziness    (Consider location/radiation/quality/duration/timing/severity/associated sxs/prior treatment) HPI Comments: Patient comes in today with a chief complaint of weakness and vertigo.  He reports that he has been experiencing the symptoms for the past week.  He had a recent fall 3 days ago and another one 5 days ago.  He reports weakness in both legs as being the reason for the fall.  He reports that his vertigo occurs with standing and resolves when he sits down.  He denies any prior history of CVA or TIA.  Denies any recent changes in speech, facial asymmetry, or confusion.    Patient is a 78 y.o. male presenting with weakness. The history is provided by the patient.  Weakness The primary symptoms include dizziness. Primary symptoms do not include headaches, syncope, loss of consciousness, altered mental status, seizures, visual change, paresthesias, focal weakness, loss of sensation, speech change, memory loss, fever, nausea or vomiting.  Dizziness also occurs with weakness. Dizziness does not occur with nausea or vomiting.  Additional symptoms include weakness and vertigo. Additional symptoms do not include neck stiffness or photophobia. Medical issues also include diabetes and hypertension. Medical issues do not include cerebral vascular accident.    Past Medical History  Diagnosis Date  . Diabetes mellitus   . Hypertension   . Hyperlipidemia   . Cholelithiasis     Dr. Matthias Hughs  . CAD (coronary artery disease)     s/p CABG in 1997. Dr. Juanda Chance.  . Venous insufficiency of leg   . BPH (benign prostatic hyperplasia)     Dr. Isabel Caprice  . Eosinophilia 2005    Since 2005  . Diverticulosis 2005    GI bleed 02/2004  . Insomnia   . Chronic kidney disease     Cr around 2 since 2005  . Hyperthyroidism      persistently low TSH. RAI treatement in 2002. Reffered to Dr. Ardyth Harps in 2007.  Marland Kitchen Proteinuria 2007    Past Surgical History  Procedure Date  . Coronary artery bypass graft     1997    No family history on file.  History  Substance Use Topics  . Smoking status: Former Games developer  . Smokeless tobacco: Not on file  . Alcohol Use: Not on file      Review of Systems  Constitutional: Negative for fever.  HENT: Negative for neck stiffness.   Eyes: Negative for photophobia.  Cardiovascular: Negative for syncope.  Gastrointestinal: Negative for nausea and vomiting.  Neurological: Positive for dizziness, vertigo and weakness. Negative for speech change, focal weakness, seizures, loss of consciousness, headaches and paresthesias.  Psychiatric/Behavioral: Negative for memory loss and altered mental status.    Allergies  Review of patient's allergies indicates no known allergies.  Home Medications   Current Outpatient Rx  Name Route Sig Dispense Refill  . AMBRISENTAN 5 MG PO TABS Oral Take 5 mg by mouth daily.      . ASPIRIN 81 MG PO TABS Oral Take 81 mg by mouth daily.      Marland Kitchen BENAZEPRIL HCL 40 MG PO TABS Oral Take 40 mg by mouth 2 (two) times daily.      . FUROSEMIDE 40 MG PO TABS Oral Take 1 tablet (40 mg total) by mouth 2 (two) times daily. 60  tablet 6  . GLIPIZIDE 10 MG PO TABS Oral Take 1 tablet (10 mg total) by mouth 2 (two) times daily before a meal. 200 tablet 3  . METOPROLOL TARTRATE 25 MG PO TABS Oral Take 1 tablet (25 mg total) by mouth 2 (two) times daily. 180 tablet 3  . NITROGLYCERIN 0.4 MG SL SUBL Sublingual Place 0.4 mg under the tongue every 5 (five) minutes as needed. For chest pain    . OMEPRAZOLE 20 MG PO CPDR Oral Take 20 mg by mouth every other day.     Marland Kitchen PAROXETINE HCL 20 MG PO TABS Oral Take 1 tablet (20 mg total) by mouth daily. 30 tablet 2  . SIMVASTATIN 20 MG PO TABS Oral Take 1 tablet (20 mg total) by mouth at bedtime. . 100 tablet 3  . TAMSULOSIN  HCL 0.4 MG PO CAPS Oral Take 1 capsule (0.4 mg total) by mouth daily. 100 capsule 3  . TRAMADOL-ACETAMINOPHEN 37.5-325 MG PO TABS Oral Take 1 tablet by mouth every 6 (six) hours as needed. For pain      BP 152/95  Temp(Src) 97.5 F (36.4 C) (Oral)  Resp 17  SpO2 97%  Physical Exam  Nursing note and vitals reviewed. Constitutional: He is oriented to person, place, and time. He appears well-developed and well-nourished. No distress.  HENT:  Head: Normocephalic and atraumatic.  Eyes: EOM are normal. Pupils are equal, round, and reactive to light.  Neck: Normal range of motion. Neck supple.  Cardiovascular: Normal rate, regular rhythm and normal heart sounds.   No murmur heard. Pulmonary/Chest: Effort normal and breath sounds normal. No respiratory distress. He has no wheezes. He has no rales.  Neurological: He is alert and oriented to person, place, and time. He has normal strength and normal reflexes. No cranial nerve deficit. Gait abnormal.       Mild right sided tongue deviation Mild right sided drooping of mouth  Skin: Skin is warm and dry. No rash noted. He is not diaphoretic. No erythema.       Significant bilateral lower extremity pitting edema.  Psychiatric: He has a normal mood and affect.    ED Course  Procedures (including critical care time)   Labs Reviewed  CBC  DIFFERENTIAL  COMPREHENSIVE METABOLIC PANEL  PROTIME-INR  URINALYSIS, WITH MICROSCOPIC  LACTIC ACID, PLASMA  CARDIAC PANEL(CRET KIN+CKTOT+MB+TROPI)   No results found.   1. Vertigo   2. Weakness   3. Fall   4. DIABETES MELLITUS, TYPE II   5. CVA (cerebral infarction)   6. Abnormality of gait      Date: 01/01/2012  Rate: 62  Rhythm:   QRS Axis: left  Intervals: normal  ST/T Wave abnormalities: nonspecific T wave changes  Conduction Disutrbances:none  Narrative Interpretation:   Old EKG Reviewed: unchanged   MDM  Patient admitted for further work up and additional testing for possible  CVA.    Pascal Lux [place], PA-C 01/02/12 1815

## 2012-01-01 NOTE — ED Notes (Signed)
Received pt via EMS with c/o weakness and dizziness x 2 weeks. Pt reports that he talked to Dr. Aundria Rud at outpatient clinic and was told he should have CT for possible stroke. 12lead LBB and afib. No cardiac S/S. Lungs clear. Possible facial droop right side.

## 2012-01-02 ENCOUNTER — Other Ambulatory Visit: Payer: Self-pay

## 2012-01-02 DIAGNOSIS — I6350 Cerebral infarction due to unspecified occlusion or stenosis of unspecified cerebral artery: Principal | ICD-10-CM

## 2012-01-02 DIAGNOSIS — I6330 Cerebral infarction due to thrombosis of unspecified cerebral artery: Secondary | ICD-10-CM

## 2012-01-02 DIAGNOSIS — R269 Unspecified abnormalities of gait and mobility: Secondary | ICD-10-CM

## 2012-01-02 LAB — BASIC METABOLIC PANEL
BUN: 29 mg/dL — ABNORMAL HIGH (ref 6–23)
Calcium: 8.7 mg/dL (ref 8.4–10.5)
Chloride: 110 mEq/L (ref 96–112)
Creatinine, Ser: 2.35 mg/dL — ABNORMAL HIGH (ref 0.50–1.35)
GFR calc Af Amer: 28 mL/min — ABNORMAL LOW (ref >90)
GFR calc non Af Amer: 24 mL/min — ABNORMAL LOW (ref >90)

## 2012-01-02 LAB — TSH: TSH: 0.568 u[IU]/mL (ref 0.350–4.500)

## 2012-01-02 MED ORDER — BENAZEPRIL HCL 20 MG PO TABS
20.0000 mg | ORAL_TABLET | Freq: Two times a day (BID) | ORAL | Status: DC
  Administered 2012-01-02 (×2): 20 mg via ORAL
  Filled 2012-01-02 (×4): qty 1

## 2012-01-02 MED ORDER — METOPROLOL TARTRATE 12.5 MG HALF TABLET
12.5000 mg | ORAL_TABLET | Freq: Two times a day (BID) | ORAL | Status: DC
  Administered 2012-01-02 – 2012-01-03 (×3): 12.5 mg via ORAL
  Filled 2012-01-02 (×5): qty 1

## 2012-01-02 NOTE — Progress Notes (Signed)
Chaplain's note:  Pt was in bathroom.  Pt's family member was present and very excited to meet me.  I introduced myself and chaplain services.  Will attempt to visit again today.  Please page if needed or requested.  Dellie Catholic  [phone] personal pager   [phone] on-call pager

## 2012-01-02 NOTE — Progress Notes (Signed)
Inpatient Diabetes Program Recommendations  AACE/ADA: New Consensus Statement on Inpatient Glycemic Control (2009)  Target Ranges:  Prepandial:   less than 140 mg/dL      Peak postprandial:   less than 180 mg/dL (1-2 hours)      Critically ill patients:  140 - 180 mg/dL   Pt with history of DM.  Takes Glipizide at home.  Inpatient Diabetes Program Recommendations Correction (SSI): Please check CBGs and cover with Novolog Sensitive SSI if elevated. HgbA1C: A1C 7.2% (01/01/12) Diet: Please change diet to Carb Modified Medium.  Note: Will follow. Ambrose Finland RN, MSN, CDE Diabetes Coordinator Inpatient Diabetes Program [phone]

## 2012-01-02 NOTE — Progress Notes (Signed)
Physical Therapy Evaluation Patient Details Name: Jeremy Sanders MRN: [id number] DOB: 01-01-1925 Today's Date: 01/02/2012  Problem List:  Patient Active Problem List  Diagnoses  . HYPERTHYROIDISM  . DIABETES MELLITUS, TYPE II  . HYPERLIPIDEMIA-MIXED  . EOSINOPHILIA  . HYPERTENSION  . CORONARY ARTERY DISEASE  . DIVERTICULOSIS, COLON  . CHOLELITHIASIS  . RENAL INSUFFICIENCY  . LEG PAIN  . INSOMNIA  . Edema  . PROTEINURIA  . BENIGN PROSTATIC HYPERTROPHY, HX OF  . CORONARY ARTERY BYPASS GRAFT, HX OF  . ABNORMALITY OF GAIT  . Generalized weakness  . Toe ulcer  . Depression  . CVA (cerebral infarction)    Past Medical History:  Past Medical History  Diagnosis Date  . Diabetes mellitus   . Hypertension   . Hyperlipidemia   . Cholelithiasis     Dr. Matthias Hughs  . CAD (coronary artery disease)     s/p CABG in 1997. Dr. Juanda Chance.  . Venous insufficiency of leg   . BPH (benign prostatic hyperplasia)     Dr. Isabel Caprice  . Eosinophilia 2005    Since 2005  . Diverticulosis 2005    GI bleed 02/2004  . Insomnia   . Chronic kidney disease     Cr around 2 since 2005  . Hyperthyroidism     persistently low TSH. RAI treatement in 2002. Reffered to Dr. Ardyth Harps in 2007.  Marland Kitchen Proteinuria 2007   Past Surgical History:  Past Surgical History  Procedure Date  . Coronary artery bypass graft     1997    PT Assessment/Plan/Recommendation PT Assessment Clinical Impression Statement: pt presents with CVA.  pt generally unsteady and tends to lean L and posteriorly.  pt's wife concerned about being able to care for him at home as he has had multiple falls.  Feel pt would benefit from ST-SNF for improved mobility, balance, and decreased falls.  Also feel pt would benefit from SW Consult for D/C planning and to A with transportation for wife as pt drives and wife does not.  Will need some long term transportation A as wife noting she feels pt should no longer be driving.   PT  Recommendation/Assessment: Patient will need skilled PT in the acute care venue PT Problem List: Decreased strength;Decreased range of motion;Decreased activity tolerance;Decreased balance;Decreased mobility;Decreased knowledge of use of DME Barriers to Discharge: None PT Therapy Diagnosis : Difficulty walking;Generalized weakness PT Plan PT Frequency: Min 3X/week PT Treatment/Interventions: DME instruction;Gait training;Stair training;Functional mobility training;Therapeutic activities;Therapeutic exercise;Balance training;Patient/family education PT Recommendation Recommendations for Other Services: OT consult (SW Consult) Follow Up Recommendations: Skilled nursing facility Equipment Recommended: Defer to next venue PT Goals  Acute Rehab PT Goals PT Goal Formulation: With patient/family Time For Goal Achievement: 2 weeks Pt will go Supine/Side to Sit: Independently PT Goal: Supine/Side to Sit - Progress: Goal set today Pt will go Sit to Supine/Side: Independently PT Goal: Sit to Supine/Side - Progress: Goal set today Pt will go Sit to Stand: with modified independence PT Goal: Sit to Stand - Progress: Goal set today Pt will Ambulate: >150 feet;with modified independence;with least restrictive assistive device PT Goal: Ambulate - Progress: Goal set today Pt will Go Up / Down Stairs: 3-5 stairs;with supervision;with least restrictive assistive device PT Goal: Up/Down Stairs - Progress: Goal set today  PT Evaluation Precautions/Restrictions  Precautions Precautions: Fall Restrictions Weight Bearing Restrictions: No Prior Functioning  Home Living Lives With: Spouse Receives Help From: Family Type of Home: House Home Layout: One level Home Access: Stairs to enter  Entrance Stairs-Rails: Left Entrance Stairs-Number of Steps: 3 Bathroom Shower/Tub: Engineer, manufacturing systems: Standard Home Adaptive Equipment: Straight cane Additional Comments: Per wife pt has had multiple  falls at home and at times she is unable to A him up from floor and has had to have mailman A or calls 911.   Prior Function Level of Independence: Independent with basic ADLs;Independent with gait;Independent with transfers;Requires assistive device for independence (Per wife not safe.  ) Able to Take Stairs?: Yes Driving: Yes (though wife is concerned about this.  ) Cognition Cognition Orientation Level: Oriented X4 Sensation/Coordination   Extremity Assessment RLE Assessment RLE Assessment: Exceptions to [facility] RLE Strength RLE Overall Strength Comments: Grossly 4/5 with limited ROM in ankle/foot secondary to edema.   LLE Assessment LLE Assessment: Exceptions to [facility] LLE Strength LLE Overall Strength Comments: Grossly 4/5 with limited ROM at ankle/foot secondary to edema.   Mobility (including Balance) Bed Mobility Bed Mobility: Yes Supine to Sit: 6: Modified independent (Device/Increase time);With rails;HOB elevated (Comment degrees) (HOB ~25) Sitting - Scoot to Edge of Bed: 7: Independent Transfers Transfers: Yes Sit to Stand: 4: Min assist;With upper extremity assist;From bed;From toilet Sit to Stand Details (indicate cue type and reason): cues to push up from bed.   Stand to Sit: 4: Min assist;To bed;To toilet Stand to Sit Details: cues to use UEs to control descent Ambulation/Gait Ambulation/Gait: Yes Ambulation/Gait Assistance: 4: Min assist Ambulation/Gait Assistance Details (indicate cue type and reason): Initially used cane for first 15' however pt unsteady and leaning L and posteriorly.  Switched to RW and pt only required MinA for guarding and cues for use of RW, upright posture.  pt's Bil LEs very edematous and notes long history of this.   Ambulation Distance (Feet): 200 Feet Assistive device: Rolling walker Gait Pattern: Step-through pattern;Shuffle;Trunk flexed Stairs: No Wheelchair Mobility Wheelchair Mobility: No  Posture/Postural Control Posture/Postural  Control: No significant limitations Exercise    End of Session PT - End of Session Equipment Utilized During Treatment: Gait belt Activity Tolerance: Patient tolerated treatment well Patient left: in bed;with call bell in reach;with family/visitor present (Sitting EOB with wife and MD) Nurse Communication: Mobility status for transfers;Mobility status for ambulation General Behavior During Session: [facility] for tasks performed Cognition: [facility] for tasks performed  Sunny Schlein, [place] [phone] 01/02/2012, 10:51 AM

## 2012-01-02 NOTE — Consult Note (Signed)
Physical Medicine and Rehabilitation Consult Reason for Consult: Stroke Referring Phsyician: Dr. Arnaldo Natal Jeremy Sanders is an 78 y.o. male.   HPI: 78 year old right-handed male admitted February 3 with complaints of lower extremity weakness x2 weeks dizziness he had 2 falls and recently began using a cane. MRI of the brain showed acute nonhemorrhagic infarction of the left midbrain and remote hemorrhagic infarction of the right cerebellum as well as multiple remote lacunar infarctions in the basal ganglia and cerebellum bilaterally. Carotid Dopplers and echocardiogram were pending. Placed on Plavix therapy as well as subcutaneous Lovenox for deep vein thrombosis prophylaxis. Physical medicine and rehabilitation was consulted to consider inpatient rehabilitation services  Review of Systems  Constitutional: Positive for malaise/fatigue.  Eyes: Negative for double vision.  Respiratory: Negative for cough and shortness of breath.   Cardiovascular: Negative for chest pain.  Genitourinary: Positive for urgency.  Musculoskeletal: Positive for falls.  Neurological: Positive for dizziness and weakness. Negative for headaches.  Psychiatric/Behavioral: Negative.    Past Medical History  Diagnosis Date  . Diabetes mellitus   . Hypertension   . Hyperlipidemia   . Cholelithiasis     Dr. Matthias Hughs  . CAD (coronary artery disease)     s/p CABG in 1997. Dr. Juanda Chance.  . Venous insufficiency of leg   . BPH (benign prostatic hyperplasia)     Dr. Isabel Caprice  . Eosinophilia 2005    Since 2005  . Diverticulosis 2005    GI bleed 02/2004  . Insomnia   . Chronic kidney disease     Cr around 2 since 2005  . Hyperthyroidism     persistently low TSH. RAI treatement in 2002. Reffered to Dr. Ardyth Harps in 2007.  Marland Kitchen Proteinuria 2007   Past Surgical History  Procedure Date  . Coronary artery bypass graft     1997   No family history on file. Social History:  reports that he has quit smoking. He does not  have any smokeless tobacco history on file. His alcohol and drug histories not on file. Allergies: No Known Allergies Medications Prior to Admission  Medication Dose Route Frequency Provider Last Rate Last Dose  . benazepril (LOTENSIN) tablet 20 mg  20 mg Oral BID Lars Mage, MD      . clopidogrel (PLAVIX) tablet 75 mg  75 mg Oral Q breakfast Kathreen Cosier, MD      . enoxaparin (LOVENOX) injection 30 mg  30 mg Subcutaneous Q24H Lars Mage, MD   30 mg at 01/01/12 2207  . furosemide (LASIX) tablet 40 mg  40 mg Oral BID Lars Mage, MD   40 mg at 01/01/12 1748  . metoprolol tartrate (LOPRESSOR) tablet 25 mg  25 mg Oral BID Lars Mage, MD   25 mg at 01/01/12 1646  . nitroGLYCERIN (NITROSTAT) SL tablet 0.4 mg  0.4 mg Sublingual Q5 min PRN Lars Mage, MD      . pantoprazole (PROTONIX) EC tablet 40 mg  40 mg Oral Q1200 Lars Mage, MD   40 mg at 01/01/12 1646  . simvastatin (ZOCOR) tablet 20 mg  20 mg Oral QHS Lars Mage, MD   20 mg at 01/01/12 2207  . Tamsulosin HCl (FLOMAX) capsule 0.4 mg  0.4 mg Oral Daily Ankit Garg, MD   0.4 mg at 01/01/12 1646  . zolpidem (AMBIEN) tablet 5 mg  5 mg Oral QHS PRN Lars Mage, MD      . DISCONTD: 0.9 %  sodium chloride infusion   Intravenous STAT Glynn Octave, MD      .  DISCONTD: zolpidem (AMBIEN) tablet 10 mg  10 mg Oral QHS PRN Lars Mage, MD       Medications Prior to Admission  Medication Sig Dispense Refill  . aspirin 81 MG tablet Take 81 mg by mouth daily.        . benazepril (LOTENSIN) 40 MG tablet Take 40 mg by mouth 2 (two) times daily.       . furosemide (LASIX) 40 MG tablet Take 40 mg by mouth 2 (two) times daily.      Marland Kitchen glipiZIDE (GLUCOTROL) 10 MG tablet Take 10 mg by mouth 2 (two) times daily before a meal.      . metoprolol tartrate (LOPRESSOR) 25 MG tablet Take 25 mg by mouth 2 (two) times daily.      . nitroGLYCERIN (NITROSTAT) 0.4 MG SL tablet Place 0.4 mg under the tongue every 5 (five) minutes as needed. For chest pain      . omeprazole  (PRILOSEC) 20 MG capsule Take 20 mg by mouth every other day.       . simvastatin (ZOCOR) 20 MG tablet Take 1 tablet (20 mg total) by mouth at bedtime. .  100 tablet  3  . Tamsulosin HCl (FLOMAX) 0.4 MG CAPS Take 0.4 mg by mouth daily.      Marland Kitchen tramadol-acetaminophen (ULTRACET) 37.5-325 MG per tablet Take 1 tablet by mouth every 6 (six) hours as needed. For pain        Home:    Functional History:   Functional Status:  Mobility:          ADL:    Cognition: Cognition Orientation Level: Oriented X4 Cognition Orientation Level: Oriented X4  Blood pressure 151/76, pulse 59, temperature 97.7 F (36.5 C), temperature source Oral, resp. rate 18, height 5\' 9"  (1.753 m), weight 102.6 kg (226 lb 3.1 oz), SpO2 93.00%. Physical Exam  Constitutional: He is oriented to person, place, and time. He appears well-developed.  HENT:  Head: Normocephalic.  Neck: Normal range of motion. Neck supple. No thyromegaly present.  Cardiovascular: Normal rate and regular rhythm.   Pulmonary/Chest: Effort normal. He has no wheezes.  Abdominal: He exhibits no distension. There is no tenderness.  Musculoskeletal: He exhibits edema and tenderness.  Neurological: He is alert and oriented to person, place, and time.       Alert and sitting EOB. Stands impulsively. Leaned to the back and left.  Strength grossly 4+ to 5/5.  Sensory exam grossly intact except for distal LE's which were 1+/2.  CN exam non-focal.  Cognitively fairly appropriate.  Mild language barrier  Skin: Skin is warm and dry.  Psychiatric: He has a normal mood and affect.    Results for orders placed during the hospital encounter of 01/01/12 (from the past 24 hour(s))  CBC     Status: Normal   Collection Time   01/01/12  9:37 AM      Component Value Range   WBC 7.1  4.0 - 10.5 (K/uL)   RBC 4.60  4.22 - 5.81 (MIL/uL)   Hemoglobin 13.6  13.0 - 17.0 (g/dL)   HCT 54.0  98.1 - 19.1 (%)   MCV 90.4  78.0 - 100.0 (fL)   MCH 29.6  26.0 - 34.0  (pg)   MCHC 32.7  30.0 - 36.0 (g/dL)   RDW 47.8  29.5 - 62.1 (%)   Platelets 193  150 - 400 (K/uL)  DIFFERENTIAL     Status: Abnormal   Collection Time   01/01/12  9:37  AM      Component Value Range   Neutrophils Relative 51  43 - 77 (%)   Neutro Abs 3.6  1.7 - 7.7 (K/uL)   Lymphocytes Relative 27  12 - 46 (%)   Lymphs Abs 1.9  0.7 - 4.0 (K/uL)   Monocytes Relative 7  3 - 12 (%)   Monocytes Absolute 0.5  0.1 - 1.0 (K/uL)   Eosinophils Relative 15 (*) 0 - 5 (%)   Eosinophils Absolute 1.0 (*) 0.0 - 0.7 (K/uL)   Basophils Relative 1  0 - 1 (%)   Basophils Absolute 0.1  0.0 - 0.1 (K/uL)  COMPREHENSIVE METABOLIC PANEL     Status: Abnormal   Collection Time   01/01/12  9:37 AM      Component Value Range   Sodium 141  135 - 145 (mEq/L)   Potassium 4.3  3.5 - 5.1 (mEq/L)   Chloride 109  96 - 112 (mEq/L)   CO2 22  19 - 32 (mEq/L)   Glucose, Bld 182 (*) 70 - 99 (mg/dL)   BUN 28 (*) 6 - 23 (mg/dL)   Creatinine, Ser 1.47 (*) 0.50 - 1.35 (mg/dL)   Calcium 8.9  8.4 - 82.9 (mg/dL)   Total Protein 6.6  6.0 - 8.3 (g/dL)   Albumin 3.0 (*) 3.5 - 5.2 (g/dL)   AST 18  0 - 37 (U/L)   ALT 10  0 - 53 (U/L)   Alkaline Phosphatase 71  39 - 117 (U/L)   Total Bilirubin 0.5  0.3 - 1.2 (mg/dL)   GFR calc non Af Amer 29 (*) >90 (mL/min)   GFR calc Af Amer 34 (*) >90 (mL/min)  PROTIME-INR     Status: Normal   Collection Time   01/01/12  9:37 AM      Component Value Range   Prothrombin Time 13.8  11.6 - 15.2 (seconds)   INR 1.04  0.00 - 1.49   LACTIC ACID, PLASMA     Status: Normal   Collection Time   01/01/12  9:37 AM      Component Value Range   Lactic Acid, Venous 1.0  0.5 - 2.2 (mmol/L)  CARDIAC PANEL(CRET KIN+CKTOT+MB+TROPI)     Status: Abnormal   Collection Time   01/01/12  9:37 AM      Component Value Range   Total CK 168  7 - 232 (U/L)   CK, MB 4.8 (*) 0.3 - 4.0 (ng/mL)   Troponin I <0.30  <0.30 (ng/mL)   Relative Index 2.9 (*) 0.0 - 2.5   URINALYSIS, WITH MICROSCOPIC     Status: Abnormal    Collection Time   01/01/12 10:47 AM      Component Value Range   Color, Urine YELLOW  YELLOW    APPearance CLEAR  CLEAR    Specific Gravity, Urine 1.016  1.005 - 1.030    pH 5.5  5.0 - 8.0    Glucose, UA 100 (*) NEGATIVE (mg/dL)   Hgb urine dipstick SMALL (*) NEGATIVE    Bilirubin Urine NEGATIVE  NEGATIVE    Ketones, ur NEGATIVE  NEGATIVE (mg/dL)   Protein, ur >562 (*) NEGATIVE (mg/dL)   Urobilinogen, UA 0.2  0.0 - 1.0 (mg/dL)   Nitrite NEGATIVE  NEGATIVE    Leukocytes, UA NEGATIVE  NEGATIVE    RBC / HPF 0-2  <3 (RBC/hpf)   Squamous Epithelial / LPF RARE  RARE   TSH     Status: Normal   Collection Time  01/01/12  3:00 PM      Component Value Range   TSH 0.568  0.350 - 4.500 (uIU/mL)  HEMOGLOBIN A1C     Status: Abnormal   Collection Time   01/01/12  3:00 PM      Component Value Range   Hemoglobin A1C 7.2 (*) <5.7 (%)   Mean Plasma Glucose 160 (*) <117 (mg/dL)  LIPID PANEL     Status: Normal   Collection Time   01/01/12  3:00 PM      Component Value Range   Cholesterol 111  0 - 200 (mg/dL)   Triglycerides 97  <161 (mg/dL)   HDL 64  >09 (mg/dL)   Total CHOL/HDL Ratio 1.7     VLDL 19  0 - 40 (mg/dL)   LDL Cholesterol 28  0 - 99 (mg/dL)   Dg Chest 2 View  6/0/4540  *RADIOLOGY REPORT*  Clinical Data: Dizziness  CHEST - 2 VIEW  Comparison: 03/11/2004  Findings: Evidence of CABG noted.  Mild enlargement cardiomediastinal silhouette is noted.  No pleural effusion.  Mild flattening of hemidiaphragms is noted which may be seen with COPD but is nonspecific.  No compression deformity.  IMPRESSION: Cardiomegaly without focal acute finding.  Original Report Authenticated By: Harrel Lemon, M.D.   Ct Head Wo Contrast  01/01/2012  *RADIOLOGY REPORT*  Clinical Data: Weakness and dizziness  CT HEAD WITHOUT CONTRAST  Technique:  Contiguous axial images were obtained from the base of the skull through the vertex without contrast.  Comparison: None.  Findings: Remote bilateral external capsule  lacunar infarcts are noted. Cortical volume loss noted with proportional ventricular prominence.  Periventricular white matter hypodensity likely indicates small vessel ischemic change.  No acute hemorrhage, acute infarction, or mass lesion is identified.  No midline shift. Orbits and paranasal sinuses demonstrate mild ethmoid mucoperiosteal thickening but are otherwise unremarkable.  No skull fracture.  IMPRESSION: Mild ethmoid sinusitis.  No acute intracranial finding.  Chronic findings as above.  Original Report Authenticated By: Harrel Lemon, M.D.   Mr Angiogram Head Wo Contrast  01/01/2012  *RADIOLOGY REPORT*  Clinical Data:  Dizziness.  Weakness.  Recent falls.  MRI HEAD WITHOUT CONTRAST MRA HEAD WITHOUT CONTRAST  Technique:  Multiplanar, multiecho pulse sequences of the brain and surrounding structures were obtained without intravenous contrast. Angiographic images of the head were obtained using MRA technique without contrast.  Comparison:  CT head without contrast 01/01/2012.  MRI HEAD  Findings:  Areas of linear acute non hemorrhagic infarction are present within the inferior left mid brain.  T2 and FLAIR hyperintensity is associated.  No other acute infarct is present.  A remote hemorrhagic infarct is present in the inferior right cerebellum.  No acute hemorrhage is present.  Smaller remote lacunar infarcts are noted in the cerebellum and basal ganglia bilaterally.  Confluent periventricular white matter changes are present bilaterally.  A remote cortical infarct is present in the high posterior left parietal lobe.  Moderate generalized atrophy is evident.  Flow is present in the major intracranial arteries.  The globes and orbits are intact.  A polyp or mucous retention cyst is evident along the inferior aspect of the right maxillary sinus.  Mild mucosal thickening is present in the left maxillary sinus.  Fluid is present in the right mastoid air cells.  IMPRESSION:  1.  Acute non hemorrhagic  infarcts of the left mid brain. 2.  Remote hemorrhagic infarct of the right cerebellum. 3.  Multiple remote lacunar infarcts of the  basal ganglia and cerebellum bilaterally. 4.  Advanced atrophy and extensive white matter disease likely reflects the sequelae of chronic microvascular ischemia. 5.  Mild sinus disease as described. 6.  Right mastoid effusion.  No obstructing nasopharyngeal lesion is evident.  MRA HEAD  Findings: Mild irregularity is present through the cavernous carotid arteries bilaterally without significant stenosis.  The A1 and M1 segments are within normal limits as well.  The MCA bifurcations are unremarkable.  Mild segmental attenuation is present in distal MCA and ACA branch vessels bilaterally.  The right vertebral artery terminates at the PICA.  A tiny branch extends to the vertebral basilar junction.  The left vertebral artery is dominant.  The left PICA origin is visualized and normal. There is some irregularity in the proximal basilar artery, surrounding the AICA origin.  There is no significant stenosis relative to the distal vessel.  The posterior cerebral arteries both originate from basilar tip. There is some attenuation of distal PCA branch vessels bilaterally.  IMPRESSION:  1.  Moderate small vessel disease, most prominently in the posterior circulation. 2.  Mild irregularity within the cavernous carotid arteries and proximal basilar artery without significant stenosis relative to the distal vessels.  These results were called by telephone on 01/01/2012  at  12:30 pm to  Dr. Hughes Better, who verbally acknowledged these results.  Original Report Authenticated By: Jamesetta Orleans. MATTERN, M.D.   Mr Brain Wo Contrast  01/01/2012  *RADIOLOGY REPORT*  Clinical Data:  Dizziness.  Weakness.  Recent falls.  MRI HEAD WITHOUT CONTRAST MRA HEAD WITHOUT CONTRAST  Technique:  Multiplanar, multiecho pulse sequences of the brain and surrounding structures were obtained without intravenous  contrast. Angiographic images of the head were obtained using MRA technique without contrast.  Comparison:  CT head without contrast 01/01/2012.  MRI HEAD  Findings:  Areas of linear acute non hemorrhagic infarction are present within the inferior left mid brain.  T2 and FLAIR hyperintensity is associated.  No other acute infarct is present.  A remote hemorrhagic infarct is present in the inferior right cerebellum.  No acute hemorrhage is present.  Smaller remote lacunar infarcts are noted in the cerebellum and basal ganglia bilaterally.  Confluent periventricular white matter changes are present bilaterally.  A remote cortical infarct is present in the high posterior left parietal lobe.  Moderate generalized atrophy is evident.  Flow is present in the major intracranial arteries.  The globes and orbits are intact.  A polyp or mucous retention cyst is evident along the inferior aspect of the right maxillary sinus.  Mild mucosal thickening is present in the left maxillary sinus.  Fluid is present in the right mastoid air cells.  IMPRESSION:  1.  Acute non hemorrhagic infarcts of the left mid brain. 2.  Remote hemorrhagic infarct of the right cerebellum. 3.  Multiple remote lacunar infarcts of the basal ganglia and cerebellum bilaterally. 4.  Advanced atrophy and extensive white matter disease likely reflects the sequelae of chronic microvascular ischemia. 5.  Mild sinus disease as described. 6.  Right mastoid effusion.  No obstructing nasopharyngeal lesion is evident.  MRA HEAD  Findings: Mild irregularity is present through the cavernous carotid arteries bilaterally without significant stenosis.  The A1 and M1 segments are within normal limits as well.  The MCA bifurcations are unremarkable.  Mild segmental attenuation is present in distal MCA and ACA branch vessels bilaterally.  The right vertebral artery terminates at the PICA.  A tiny branch extends to the vertebral basilar junction.  The left vertebral artery is  dominant.  The left PICA origin is visualized and normal. There is some irregularity in the proximal basilar artery, surrounding the AICA origin.  There is no significant stenosis relative to the distal vessel.  The posterior cerebral arteries both originate from basilar tip. There is some attenuation of distal PCA branch vessels bilaterally.  IMPRESSION:  1.  Moderate small vessel disease, most prominently in the posterior circulation. 2.  Mild irregularity within the cavernous carotid arteries and proximal basilar artery without significant stenosis relative to the distal vessels.  These results were called by telephone on 01/01/2012  at  12:30 pm to  Dr. Hughes Better, who verbally acknowledged these results.  Original Report Authenticated By: Jamesetta Orleans. MATTERN, M.D.    Assessment/Plan: Diagnosis: Left mid brain infarcts/ old BG and cerebellar infarcts 1. Does the need for close, 24 hr/day medical supervision in concert with the patient's rehab needs make it unreasonable for this patient to be served in a less intensive setting? Potentially 2. Co-Morbidities requiring supervision/potential complications: DM, HTN, CAD 3. Due to bladder management, bowel management, safety, skin/wound care, disease management, medication administration and patient education, does the patient require 24 hr/day rehab nursing? Potentially 4. Does the patient require coordinated care of a physician, rehab nurse, PT (1-2 hrs/day, 5 days/week) and OT (1-2 hrs/day, 5 days/week) to address physical and functional deficits in the context of the above medical diagnosis(es)? No Addressing deficits in the following areas: balance, endurance, locomotion, strength, transferring, bowel/bladder control, bathing, dressing, feeding, grooming and toileting 5. Can the patient actively participate in an intensive therapy program of at least 3 hrs of therapy per day at least 5 days per week? Potentially 6. The potential for patient to make  measurable gains while on inpatient rehab is fair 7. Anticipated functional outcomes upon discharge from inpatients are supervision to mod I PT, supervision to mod I OT,  8. Estimated rehab length of stay to reach the above functional goals is: ? 9. Does the patient have adequate social supports to accommodate these discharge functional goals? Yes 10. Anticipated D/C setting: Home 11. Anticipated post D/C treatments: Outpt therapy 12. Overall Rehab/Functional Prognosis: good  RECOMMENDATIONS: This patient's condition is appropriate for continued rehabilitative care in the following setting: Outpt Patient has agreed to participate in recommended program. Yes Note that insurance prior authorization may be required for reimbursement for recommended care.  Comment: hospital day #2.  Already minimal asistance.  Probably had baseline balance issues.  Probably best suited for outpt or home health rehab.  Rehab RN to follow up.   Ivory Broad, MD 01/02/2012

## 2012-01-02 NOTE — ED Provider Notes (Signed)
Medical screening examination/treatment/procedure(s) were conducted as a shared visit with non-physician practitioner(s) and myself.  I personally evaluated the patient during the encounter  1 week of generalized weakness, dizziness, vertigo, frequent falls.  Questionable facial droop.  No ataxia on finger to nose.  +Romberg.  Negative test of skew, transient vertical nystagmus, head impulse testing wnl.  MRI t/o CVA and VBI.  Glynn Octave, MD 01/02/12 [phone]

## 2012-01-02 NOTE — Progress Notes (Signed)
VASCULAR LAB PRELIMINARY  PRELIMINARY  PRELIMINARY  PRELIMINARY  Carotid duplex  completed.    Preliminary report:  Bilateral:  No evidence of hemodynamically significant internal carotid artery stenosis.   Vertebral artery flow is antegrade.      Terance Hart, RVT 01/02/2012, 12:06 PM

## 2012-01-02 NOTE — Progress Notes (Signed)
Spoke with Dr.Garg about patient running Afib this morning and converting to 1st degree heart block with sinus brady, pt had been asymptomatic all day. Now the patient has 1st degree heart block with PVC's and PJC's. MD aware pt is asymptomatic. Will continue to monitor.

## 2012-01-02 NOTE — Progress Notes (Addendum)
Subjective: No events overnight, pt feels well  Objective: Vital signs in last 24 hours: Filed Vitals:   01/01/12 2221 01/02/12 0544 01/02/12 0900 01/02/12 1400  BP: 157/91 151/76 125/69 117/56  Pulse: 57 59 53 51  Temp: 97.2 F (36.2 C) 97.7 F (36.5 C) 97.5 F (36.4 C) 97.2 F (36.2 C)  TempSrc: Oral Oral Oral Oral  Resp: 20 18 18 20   Height:      Weight: 226 lb 3.1 oz (102.6 kg)     SpO2: 95% 93% 98% 94%   Weight change:   Intake/Output Summary (Last 24 hours) at 01/02/12 1534 Last data filed at 01/02/12 1254  Gross per 24 hour  Intake    360 ml  Output      0 ml  Net    360 ml   Physical Exam: GEN: NAD.  Alert and oriented x 3.  Pleasant, conversant, and cooperative to exam. RESP:  CTAB, no w/r/r CARDIOVASCULAR: irregular rhythm, S1, S2, no m/r/g ABDOMEN: soft, NT/ND, NABS EXT: warm and dry. No edema in b/l LE  Lab Results: Basic Metabolic Panel:  Lab 01/02/12 0102 01/01/12 0937  NA 140 141  K 3.8 4.3  CL 110 109  CO2 20 22  GLUCOSE 170* 182*  BUN 29* 28*  CREATININE 2.35* 1.99*  CALCIUM 8.7 8.9  MG -- --  PHOS -- --   Liver Function Tests:  Lab 01/01/12 0937  AST 18  ALT 10  ALKPHOS 71  BILITOT 0.5  PROT 6.6  ALBUMIN 3.0*   CBC:  Lab 01/01/12 0937  WBC 7.1  NEUTROABS 3.6  HGB 13.6  HCT 41.6  MCV 90.4  PLT 193   Cardiac Enzymes:  Lab 01/01/12 0937  CKTOTAL 168  CKMB 4.8*  CKMBINDEX --  TROPONINI <0.30   Hemoglobin A1C:  Lab 01/01/12 1500  HGBA1C 7.2*   Fasting Lipid Panel:  Lab 01/01/12 1500  CHOL 111  HDL 64  LDLCALC 28  TRIG 97  CHOLHDL 1.7  LDLDIRECT --   Thyroid Function Tests:  Lab 01/01/12 1500  TSH 0.568  T4TOTAL --  FREET4 --  T3FREE --  THYROIDAB --   Coagulation:  Lab 01/01/12 0937  LABPROT 13.8  INR 1.04   Anemia Panel: No results found for this basename: VITAMINB12,FOLATE,FERRITIN,TIBC,IRON,RETICCTPCT in the last 168 hours Urine Drug Screen: Drugs of Abuse  No results found for this  basename: labopia, cocainscrnur, labbenz, amphetmu, thcu, labbarb    Alcohol Level: No results found for this basename: ETH:2 in the last 168 hours Urinalysis:  Lab 01/01/12 1047  COLORURINE YELLOW  LABSPEC 1.016  PHURINE 5.5  GLUCOSEU 100*  HGBUR SMALL*  BILIRUBINUR NEGATIVE  KETONESUR NEGATIVE  PROTEINUR >300*  UROBILINOGEN 0.2  NITRITE NEGATIVE  LEUKOCYTESUR NEGATIVE    Micro Results: No results found for this or any previous visit (from the past 240 hour(s)). Studies/Results: Dg Chest 2 View  01/01/2012  *RADIOLOGY REPORT*  Clinical Data: Dizziness  CHEST - 2 VIEW  Comparison: 03/11/2004  Findings: Evidence of CABG noted.  Mild enlargement cardiomediastinal silhouette is noted.  No pleural effusion.  Mild flattening of hemidiaphragms is noted which may be seen with COPD but is nonspecific.  No compression deformity.  IMPRESSION: Cardiomegaly without focal acute finding.  Original Report Authenticated By: Harrel Lemon, M.D.   Ct Head Wo Contrast  01/01/2012  *RADIOLOGY REPORT*  Clinical Data: Weakness and dizziness  CT HEAD WITHOUT CONTRAST  Technique:  Contiguous axial images were obtained from the  base of the skull through the vertex without contrast.  Comparison: None.  Findings: Remote bilateral external capsule lacunar infarcts are noted. Cortical volume loss noted with proportional ventricular prominence.  Periventricular white matter hypodensity likely indicates small vessel ischemic change.  No acute hemorrhage, acute infarction, or mass lesion is identified.  No midline shift. Orbits and paranasal sinuses demonstrate mild ethmoid mucoperiosteal thickening but are otherwise unremarkable.  No skull fracture.  IMPRESSION: Mild ethmoid sinusitis.  No acute intracranial finding.  Chronic findings as above.  Original Report Authenticated By: Harrel Lemon, M.D.   Mr Angiogram Head Wo Contrast  01/01/2012  *RADIOLOGY REPORT*  Clinical Data:  Dizziness.  Weakness.  Recent  falls.  MRI HEAD WITHOUT CONTRAST MRA HEAD WITHOUT CONTRAST  Technique:  Multiplanar, multiecho pulse sequences of the brain and surrounding structures were obtained without intravenous contrast. Angiographic images of the head were obtained using MRA technique without contrast.  Comparison:  CT head without contrast 01/01/2012.  MRI HEAD  Findings:  Areas of linear acute non hemorrhagic infarction are present within the inferior left mid brain.  T2 and FLAIR hyperintensity is associated.  No other acute infarct is present.  A remote hemorrhagic infarct is present in the inferior right cerebellum.  No acute hemorrhage is present.  Smaller remote lacunar infarcts are noted in the cerebellum and basal ganglia bilaterally.  Confluent periventricular white matter changes are present bilaterally.  A remote cortical infarct is present in the high posterior left parietal lobe.  Moderate generalized atrophy is evident.  Flow is present in the major intracranial arteries.  The globes and orbits are intact.  A polyp or mucous retention cyst is evident along the inferior aspect of the right maxillary sinus.  Mild mucosal thickening is present in the left maxillary sinus.  Fluid is present in the right mastoid air cells.  IMPRESSION:  1.  Acute non hemorrhagic infarcts of the left mid brain. 2.  Remote hemorrhagic infarct of the right cerebellum. 3.  Multiple remote lacunar infarcts of the basal ganglia and cerebellum bilaterally. 4.  Advanced atrophy and extensive white matter disease likely reflects the sequelae of chronic microvascular ischemia. 5.  Mild sinus disease as described. 6.  Right mastoid effusion.  No obstructing nasopharyngeal lesion is evident.  MRA HEAD  Findings: Mild irregularity is present through the cavernous carotid arteries bilaterally without significant stenosis.  The A1 and M1 segments are within normal limits as well.  The MCA bifurcations are unremarkable.  Mild segmental attenuation is present in  distal MCA and ACA branch vessels bilaterally.  The right vertebral artery terminates at the PICA.  A tiny branch extends to the vertebral basilar junction.  The left vertebral artery is dominant.  The left PICA origin is visualized and normal. There is some irregularity in the proximal basilar artery, surrounding the AICA origin.  There is no significant stenosis relative to the distal vessel.  The posterior cerebral arteries both originate from basilar tip. There is some attenuation of distal PCA branch vessels bilaterally.  IMPRESSION:  1.  Moderate small vessel disease, most prominently in the posterior circulation. 2.  Mild irregularity within the cavernous carotid arteries and proximal basilar artery without significant stenosis relative to the distal vessels.  These results were called by telephone on 01/01/2012  at  12:30 pm to  Dr. Hughes Better, who verbally acknowledged these results.  Original Report Authenticated By: Jamesetta Orleans. MATTERN, M.D.   Mr Brain Wo Contrast  01/01/2012  *RADIOLOGY REPORT*  Clinical Data:  Dizziness.  Weakness.  Recent falls.  MRI HEAD WITHOUT CONTRAST MRA HEAD WITHOUT CONTRAST  Technique:  Multiplanar, multiecho pulse sequences of the brain and surrounding structures were obtained without intravenous contrast. Angiographic images of the head were obtained using MRA technique without contrast.  Comparison:  CT head without contrast 01/01/2012.  MRI HEAD  Findings:  Areas of linear acute non hemorrhagic infarction are present within the inferior left mid brain.  T2 and FLAIR hyperintensity is associated.  No other acute infarct is present.  A remote hemorrhagic infarct is present in the inferior right cerebellum.  No acute hemorrhage is present.  Smaller remote lacunar infarcts are noted in the cerebellum and basal ganglia bilaterally.  Confluent periventricular white matter changes are present bilaterally.  A remote cortical infarct is present in the high posterior left  parietal lobe.  Moderate generalized atrophy is evident.  Flow is present in the major intracranial arteries.  The globes and orbits are intact.  A polyp or mucous retention cyst is evident along the inferior aspect of the right maxillary sinus.  Mild mucosal thickening is present in the left maxillary sinus.  Fluid is present in the right mastoid air cells.  IMPRESSION:  1.  Acute non hemorrhagic infarcts of the left mid brain. 2.  Remote hemorrhagic infarct of the right cerebellum. 3.  Multiple remote lacunar infarcts of the basal ganglia and cerebellum bilaterally. 4.  Advanced atrophy and extensive white matter disease likely reflects the sequelae of chronic microvascular ischemia. 5.  Mild sinus disease as described. 6.  Right mastoid effusion.  No obstructing nasopharyngeal lesion is evident.  MRA HEAD  Findings: Mild irregularity is present through the cavernous carotid arteries bilaterally without significant stenosis.  The A1 and M1 segments are within normal limits as well.  The MCA bifurcations are unremarkable.  Mild segmental attenuation is present in distal MCA and ACA branch vessels bilaterally.  The right vertebral artery terminates at the PICA.  A tiny branch extends to the vertebral basilar junction.  The left vertebral artery is dominant.  The left PICA origin is visualized and normal. There is some irregularity in the proximal basilar artery, surrounding the AICA origin.  There is no significant stenosis relative to the distal vessel.  The posterior cerebral arteries both originate from basilar tip. There is some attenuation of distal PCA branch vessels bilaterally.  IMPRESSION:  1.  Moderate small vessel disease, most prominently in the posterior circulation. 2.  Mild irregularity within the cavernous carotid arteries and proximal basilar artery without significant stenosis relative to the distal vessels.  These results were called by telephone on 01/01/2012  at  12:30 pm to  Dr. Hughes Better,  who verbally acknowledged these results.  Original Report Authenticated By: Jamesetta Orleans. MATTERN, M.D.   Medications: I have reviewed the patient's current medications. Scheduled Meds:   . benazepril  20 mg Oral BID  . clopidogrel  75 mg Oral Q breakfast  . enoxaparin  30 mg Subcutaneous Q24H  . furosemide  40 mg Oral BID  . metoprolol tartrate  25 mg Oral BID  . pantoprazole  40 mg Oral Q1200  . simvastatin  20 mg Oral QHS  . Tamsulosin HCl  0.4 mg Oral Daily   Continuous Infusions:  PRN Meds:.nitroGLYCERIN, zolpidem Assessment/Plan: # CVA (cerebral infarction)  MRI shows acute infarct in the L midbrain and multiple prior infarcts in the b/l basal ganglia and cerebellum. Balance issues likely 2/2 this recent CVA and  accumulation of prior insults. Pt too high functioning for CIR.  PT recommends SNF, pt resistant.  Will have to discuss.  EKG showed clear p-waves this morning c 1st degree AVB, in contrast to a fib last night.  May have PAF. Will need to consider anticoagulation.  - f/u carotid doppler, prelim shows no stenosis b/l - f/u 2-D echo  - telemetry  - plavix  - PT/OT  - consider anticoagulation, but has been falling a lot   # PAF Pt was in a-fib last night, now in sinus brady with 1st degree avb.  Was down to 48 last night.  Pressure has been reasonably controlled, so will decrease metop to 12.5 bid. - decrease metop - consider anticoagulation  # HTN  Pt was on multiple antihypertensives and has been c/o orthostatic sx, though not orthostatic in ED. Pressure reasonably controlled - con't lasix 40mg  bid  - decrease metop to 12.5 bid - hold norvasc  - restart benazepril  # Diabetes, type II  A1c 7.2. - SSI while in hospital   # Hyperlipidemia  Lipids well controlled. - con't zocor   # CAD  No CP, palp, SOB. Was on ASA 81mg . Switched to plavix   #Lymphedema  Chronic problem, will cont lasix. Has been improving over the past few months.  - per PMD notes,  did not want lymphedema clinic  # HYPERTHYROIDISM  H/o hyperthyroidism, but TSH wnl - NTD  #Ppx  - lovenox  - protonix   LOS: 1 day   [facility], BEN 01/02/2012, 3:34 PM  Internal Medicine Teaching Service Attending Note Date: 01/02/2012  Patient name: Jeremy Sanders  Medical record number: [id number]  Date of birth: Nov 17, 1925    This patient has been seen and discussed with Dr. Abner Greenspan. Please see his note for complete details. I concur with his findings and recommendations for Jeremy Sanders.   Judyann Munson 01/02/2012, 3:49 PM

## 2012-01-02 NOTE — Progress Notes (Signed)
Spoke with Rosey Bath who was watching at the telemetry monitors and she said that pt's HR was 48. I went in to check on the patient and he was sleep. I woke him up and asked was he feeling ok, he said that he was feeling "fine" and he was "ok" and he said he didn't sleep much last night. I called back to [facility] at the telemetry monitors and pt's heart rate was up to 52. Pt converted from Afib this morning to 1st degree heart block sinus brady. Telemetry strip on shadow chart. Will continue to monitor.

## 2012-01-02 NOTE — Progress Notes (Signed)
Rehab admissions - Evaluated for possible admission.  Please see rehab MD consult recommending outpatient or HH therapies.  Noted PT recommending SNF.  Agree patient likely to need [facility] or SNF level therapies.  Pager [phone]

## 2012-01-02 NOTE — Progress Notes (Signed)
  Echocardiogram 2D Echocardiogram has been performed.  Jeremy Sanders 01/02/2012, 4:38 PM

## 2012-01-02 NOTE — Progress Notes (Signed)
01-02-12 UR completed. Ronny Flurry RN BSN

## 2012-01-03 ENCOUNTER — Encounter: Payer: Medicare Other | Admitting: Internal Medicine

## 2012-01-03 DIAGNOSIS — R42 Dizziness and giddiness: Secondary | ICD-10-CM

## 2012-01-03 LAB — BASIC METABOLIC PANEL
BUN: 35 mg/dL — ABNORMAL HIGH (ref 6–23)
Chloride: 107 mEq/L (ref 96–112)
Creatinine, Ser: 2.73 mg/dL — ABNORMAL HIGH (ref 0.50–1.35)
GFR calc Af Amer: 23 mL/min — ABNORMAL LOW (ref >90)
GFR calc non Af Amer: 20 mL/min — ABNORMAL LOW (ref >90)

## 2012-01-03 MED ORDER — AMLODIPINE BESYLATE 5 MG PO TABS
5.0000 mg | ORAL_TABLET | Freq: Every day | ORAL | Status: DC
  Administered 2012-01-03: 5 mg via ORAL
  Filled 2012-01-03: qty 1

## 2012-01-03 MED ORDER — INSULIN ASPART 100 UNIT/ML ~~LOC~~ SOLN
0.0000 [IU] | Freq: Three times a day (TID) | SUBCUTANEOUS | Status: DC
  Administered 2012-01-03: 11 [IU] via SUBCUTANEOUS
  Administered 2012-01-04: 3 [IU] via SUBCUTANEOUS
  Filled 2012-01-03: qty 3

## 2012-01-03 MED ORDER — AMLODIPINE BESYLATE 10 MG PO TABS
10.0000 mg | ORAL_TABLET | Freq: Every day | ORAL | Status: DC
  Filled 2012-01-03: qty 1

## 2012-01-03 MED ORDER — SODIUM CHLORIDE 0.9 % IV BOLUS (SEPSIS)
500.0000 mL | Freq: Once | INTRAVENOUS | Status: AC
  Administered 2012-01-03: 500 mL via INTRAVENOUS

## 2012-01-03 NOTE — Progress Notes (Signed)
Clinical Social Work-Full assessment in shadow chart. CSW met with pt and wife at bedside. Pt has changed his minds and is declining SNF. PT and wife would both like to return home with home health. Per pt and PT pt walked with rolling walker and showered on his own. Pt and wife relay that while he may need some additional equipment they believe they can manage him at home. CSW discussed fall safety with wife and she continues to feel comfortable with pt returning home. CSW will alert treatment team and CM. Jodean Lima, [phone]

## 2012-01-03 NOTE — Progress Notes (Signed)
   CARE MANAGEMENT NOTE 01/03/2012  Patient:  Jeremy Sanders, Jeremy Sanders   Account Number:  [id number]  Date Initiated:  01/02/2012  Documentation initiated by:  Ronny Flurry  Subjective/Objective Assessment:   DX: CVA     Action/Plan:   Referral to assist with [facility] needs as pt and wife declined SNF   Anticipated DC Date:  01/03/2012   Anticipated DC Plan:  HOME W HOME HEALTH SERVICES         Choice offered to / List presented to:     DME arranged  WALKER - Lavone Nian      DME agency  [facility]     HH arranged  HH-2 PT      [facility] agency  [facility]   Status of service:  Completed, signed off Medicare Important Message given?   (If response is "NO", the following Medicare IM given date fields will be blank) Date Medicare IM given:   Date Additional Medicare IM given:    Discharge Disposition:  HOME W HOME HEALTH SERVICES  Per UR Regulation:  Reviewed for med. necessity/level of care/duration of stay  Comments:  01/03/2012 Pt for HHPT, and safety eval. Rolling walker also ordered. [facility] will provide services for this pt. Johny Shock RN MPH Case manager [phone]  Physical Medicine and Rehabilitation Consult, recommending outpatient  PT

## 2012-01-03 NOTE — Evaluation (Addendum)
Occupational Therapy Evaluation Patient Details Name: Jeremy Sanders MRN: [id number] DOB: 1925-08-24 Today's Date: 01/03/2012 9:30-9:44  Problem List:  Patient Active Problem List  Diagnoses  . HYPERTHYROIDISM  . DIABETES MELLITUS, TYPE II  . HYPERLIPIDEMIA-MIXED  . EOSINOPHILIA  . HYPERTENSION  . CORONARY ARTERY DISEASE  . DIVERTICULOSIS, COLON  . CHOLELITHIASIS  . RENAL INSUFFICIENCY  . LEG PAIN  . INSOMNIA  . Edema  . PROTEINURIA  . BENIGN PROSTATIC HYPERTROPHY, HX OF  . CORONARY ARTERY BYPASS GRAFT, HX OF  . ABNORMALITY OF GAIT  . Generalized weakness  . Toe ulcer  . Depression  . CVA (cerebral infarction)    Past Medical History:  Past Medical History  Diagnosis Date  . Diabetes mellitus   . Hypertension   . Hyperlipidemia   . Cholelithiasis     Dr. Matthias Hughs  . CAD (coronary artery disease)     s/p CABG in 1997. Dr. Juanda Chance.  . Venous insufficiency of leg   . BPH (benign prostatic hyperplasia)     Dr. Isabel Caprice  . Eosinophilia 2005    Since 2005  . Diverticulosis 2005    GI bleed 02/2004  . Insomnia   . Chronic kidney disease     Cr around 2 since 2005  . Hyperthyroidism     persistently low TSH. RAI treatement in 2002. Reffered to Dr. Ardyth Harps in 2007.  Marland Kitchen Proteinuria 2007   Past Surgical History:  Past Surgical History  Procedure Date  . Coronary artery bypass graft     1997    OT Assessment/Plan/Recommendation OT Assessment Clinical Impression Statement: This 78 yo male admitted with falls weakness with MRI showing acute infarct in the L midbrain and multiple prior infarcts in bil basal ganglia and cerebellum. Pt presents to acute OT with problems below thus affecing pts's PLOF at I with BADLs. Will benefit from acute OT with  follow up OT at SNF (if refuses then [facility]) to get back to an I/Mod I level.  OT Recommendation/Assessment: Patient will need skilled OT in the acute care venue OT Problem List: Decreased strength;Decreased safety  awareness;Impaired balance (sitting and/or standing) Barriers to Discharge: Other (comment) (safety awareness) OT Therapy Diagnosis : Generalized weakness;Cognitive deficits OT Plan OT Frequency: Min 2X/week OT Treatment/Interventions: Self-care/ADL training;Cognitive remediation/compensation;Patient/family education;Balance training OT Recommendation Follow Up Recommendations: Skilled nursing facility;Other (comment) (if refuses SNF then [facility]) Equipment Recommended: Tub/shower seat;Other (comment) (with back ([facility] from [facility]) if D/Cs home) Individuals Consulted Consulted and Agree with Results and Recommendations: Other (comment) (Pt states he does not want to go to SNF) OT Goals Acute Rehab OT Goals OT Goal Formulation: With patient Time For Goal Achievement: 7 days  ADL Goals Pt Will Perform Grooming: with set-up;Unsupported;Standing at sink;Other (comment) (2 tasks) ADL Goal: Grooming - Progress: Goal set today  Pt Will Perform Upper Body Bathing: with set-up;Unsupported;Sit to stand in shower ADL Goal: Upper Body Bathing - Progress: Goal set today  Pt Will Perform Lower Body Bathing: with set-up;Unsupported;Sit to stand in shower ADL Goal: Lower Body Bathing - Progress: Goal set today  Pt Will Perform Upper Body Dressing: with supervision;Sit to stand from chair;Sit to stand from bed;Other (comment) (pt gathering his own items at RW level) ADL Goal: Upper Body Dressing - Progress: Goal set today  Pt Will Perform Lower Body Dressing: with supervision;Unsupported;Sit to stand from bed;Sit to stand from chair;Other (comment) (pt gathering his own items at RW level) ADL Goal: Lower Body Dressing - Progress: Goal set today  Pt Will Perform Tub/Shower Transfer: with supervision;Ambulation;Shower seat with back ADL Goal: Web designer - Progress: Goal set today  OT Evaluation Precautions/Restrictions  Precautions Precautions: Fall Required Braces or Orthoses:  No Restrictions Weight Bearing Restrictions: No Prior Functioning Home Living Lives With: Spouse Receives Help From: Family Type of Home: House Home Layout: One level Home Access: Stairs to enter Entrance Stairs-Rails: Left Entrance Stairs-Number of Steps: 3 Bathroom Shower/Tub: Forensic scientist: Standard Bathroom Accessibility: Yes How Accessible: Accessible via walker Home Adaptive Equipment: Straight cane;Grab bars around toilet;Grab bars in shower Prior Function Level of Independence: Independent with basic ADLs;Independent with gait;Independent with transfers;Requires assistive device for independence (recently using a SPC) Driving: Yes Vocation: Retired ADL ADL Eating/Feeding: Simulated;Set up Where Assessed - Eating/Feeding: Edge of bed Grooming: Simulated;Set up;Supervision/safety;Other (comment) (with min guard A for standing) Where Assessed - Grooming: Unsupported;Standing at sink Upper Body Bathing: Simulated;Set up Where Assessed - Upper Body Bathing: Unsupported;Sitting, bed Lower Body Bathing: Supervision/safety;Set up Lower Body Bathing Details (indicate cue type and reason): with min guard A standing Where Assessed - Lower Body Bathing: Unsupported;Sit to stand from bed;Sit to stand from chair Upper Body Dressing: Simulated;Set up Where Assessed - Upper Body Dressing: Sitting, bed;Unsupported Lower Body Dressing: Simulated;Supervision/safety;Set up Lower Body Dressing Details (indicate cue type and reason): with min guard A for standing Where Assessed - Lower Body Dressing: Unsupported;Sit to stand from bed;Sit to stand from chair Toilet Transfer: Performed;Other (comment) (min guard A) Toilet Transfer Method: Proofreader: Comfort height toilet;Grab bars Toileting - Clothing Manipulation: Simulated;Independent;Other (comment) (with min guard A standing) Where Assessed - Toileting Clothing Manipulation:  Standing Toileting - Hygiene: Simulated;Independent Where Assessed - Toileting Hygiene: Sit on 3-in-1 or toilet Tub/Shower Transfer: Not assessed Tub/Shower Transfer Method: Not assessed Equipment Used: Rolling walker Ambulation Related to ADLs: min guard A with RW with one LOB that pt self corrected ADL Comments: Pt does not wear socks at home only bedrooms shoes that he slips his feet into secondary to very edematous legs and feet Vision/Perception    Cognition Cognition Arousal/Alertness: Awake/alert Overall Cognitive Status: Impaired Orientation Level: Oriented X4 Safety/Judgement: Decreased awareness of safety precautions;Decreased safety judgement for tasks assessed Decreased Safety/Judgement: Decreased awareness of need for assistance Sensation/Coordination   Extremity Assessment RUE Assessment RUE Assessment: Within Functional Limits LUE Assessment LUE Assessment: Within Functional Limits Mobility  Bed Mobility Bed Mobility: No Transfers Transfers: Yes Sit to Stand: With upper extremity assist;From bed;Other (comment) (min guard A) Stand to Sit: With upper extremity assist;To bed;Other (comment) (min guard A) Exercises   End of Session OT - End of Session Equipment Utilized During Treatment: Other (comment) (RW) Activity Tolerance: Patient tolerated treatment well Patient left: in bed;with call bell in reach;Other (comment) (sitting EOB) General Behavior During Session: [facility] for tasks performed Cognition: Impaired (safety awareness)   Evette Georges [phone] 01/03/2012, 11:04 AM

## 2012-01-03 NOTE — Progress Notes (Signed)
Chaplain's note:  Introduced myself to pt.  I met his wife in a previous visit attempt.  Wife of pt at bedside.  Pt very excited to be going home tomorrow.  Please page if needed or requested. [employer]  [phone]

## 2012-01-03 NOTE — Progress Notes (Addendum)
Subjective: No events overnight, pt con't to fluctuate b/w afib and sinus.  Objective: Vital signs in last 24 hours: Filed Vitals:   01/03/12 0800 01/03/12 0907 01/03/12 1042 01/03/12 1043  BP: 149/86 151/68 149/76 149/76  Pulse: 67 83 72   Temp:  97.5 F (36.4 C)    TempSrc:  Oral    Resp: 18 20    Height:      Weight:      SpO2:  98%     Weight change: 2 lb 3.3 oz (1 kg)  Intake/Output Summary (Last 24 hours) at 01/03/12 1215 Last data filed at 01/03/12 0855  Gross per 24 hour  Intake    480 ml  Output      0 ml  Net    480 ml   Physical Exam: GEN: NAD. Alert and oriented x 3. Pleasant, conversant, and cooperative to exam.  RESP: CTAB, no w/r/r  CARDIOVASCULAR: irregular rhythm, S1, S2, no m/r/g  ABDOMEN: soft, NT/ND, NABS  EXT: warm and dry. No edema in b/l LE  Lab Results: Basic Metabolic Panel:  Lab 01/03/12 1610 01/02/12 0750  NA 140 140  K 3.8 3.8  CL 107 110  CO2 24 20  GLUCOSE 137* 170*  BUN 35* 29*  CREATININE 2.73* 2.35*  CALCIUM 9.2 8.7  MG -- --  PHOS -- --   Liver Function Tests:  Lab 01/01/12 0937  AST 18  ALT 10  ALKPHOS 71  BILITOT 0.5  PROT 6.6  ALBUMIN 3.0*   CBC:  Lab 01/01/12 0937  WBC 7.1  NEUTROABS 3.6  HGB 13.6  HCT 41.6  MCV 90.4  PLT 193   Cardiac Enzymes:  Lab 01/01/12 0937  CKTOTAL 168  CKMB 4.8*  CKMBINDEX --  TROPONINI <0.30   Hemoglobin A1C:  Lab 01/01/12 1500  HGBA1C 7.2*   Fasting Lipid Panel:  Lab 01/01/12 1500  CHOL 111  HDL 64  LDLCALC 28  TRIG 97  CHOLHDL 1.7  LDLDIRECT --   Thyroid Function Tests:  Lab 01/01/12 1500  TSH 0.568  T4TOTAL --  FREET4 --  T3FREE --  THYROIDAB --   Coagulation:  Lab 01/01/12 0937  LABPROT 13.8  INR 1.04   Urinalysis:  Lab 01/01/12 1047  COLORURINE YELLOW  LABSPEC 1.016  PHURINE 5.5  GLUCOSEU 100*  HGBUR SMALL*  BILIRUBINUR NEGATIVE  KETONESUR NEGATIVE  PROTEINUR >300*  UROBILINOGEN 0.2  NITRITE NEGATIVE  LEUKOCYTESUR NEGATIVE     Micro Results: No results found for this or any previous visit (from the past 240 hour(s)). Studies/Results: No results found. Medications: I have reviewed the patient's current medications. Scheduled Meds:   . amLODipine  5 mg Oral Daily  . clopidogrel  75 mg Oral Q breakfast  . enoxaparin  30 mg Subcutaneous Q24H  . metoprolol tartrate  12.5 mg Oral BID  . pantoprazole  40 mg Oral Q1200  . simvastatin  20 mg Oral QHS  . sodium chloride  500 mL Intravenous Once  . Tamsulosin HCl  0.4 mg Oral Daily  . DISCONTD: benazepril  20 mg Oral BID  . DISCONTD: furosemide  40 mg Oral BID  . DISCONTD: metoprolol tartrate  25 mg Oral BID   Continuous Infusions:  PRN Meds:.nitroGLYCERIN, zolpidem  Assessment/Plan: # CVA (cerebral infarction)  MRI shows acute infarct in the L midbrain and multiple prior infarcts in the b/l basal ganglia and cerebellum. Balance issues likely 2/2 this recent CVA and accumulation of prior insults.  Pt doing  better with PT today. Pt declines SNF. - f/u carotid doppler, prelim shows no stenosis b/l  - 2-D echo, EF 50%, inadequate to assess wall motion - telemetry - plavix  - PT/OT  - consider anticoagulation, but has been falling a lot - HHPT, walker - home safety eval  # AKI Cr trending up, pt has had good PO intake. - 500 cc bolus - urine lytes- - hold lasix, enalapril - repeat bmet in a.m.  # PAF  Pt fluctuates b/w a-fib and sinus with 1st degree AVB.  Regardless, may need anticoagulation in future. - metop 12.5 bid - consider anticoagulation as outpatient once out of hospital and has PT done, can consider how unstable he is.  #HTN  Controlled, no longer orthostatic. - holding lasix 2/2 aki - decrease metop to 12.5 bid  - start norvasc 10mg  - Hold benazepril 2/2 aki  # Diabetes, type II  A1c 7.2.  - SSI while in hospital   # Hyperlipidemia  Lipids well controlled.  - con't zocor   # CAD  No CP, palp, SOB. Was on ASA 81mg .  Switched to plavix   #Lymphedema  Chronic problem, will cont lasix. Has been improving over the past few months.  - per PMD notes, did not want lymphedema clinic   # HYPERTHYROIDISM  H/o hyperthyroidism, but TSH wnl  - NTD   #Ppx  - lovenox  - protonix   LOS: 2 days   Abner Greenspan, BEN 01/03/2012, 12:15 PM  Internal Medicine Teaching Service Attending Note Date: 01/03/2012  Patient name: Jeremy Sanders  Medical record number: [id number]  Date of birth: 06/15/1925    This patient has been seen and discussed with Dr. Abner Greenspan. Please see his note for complete details. I concur with his findings with the following additions/corrections: will repeat BMP tomorrow morning to see if SCr improved after holding ace-inhibitor and diuretics. Patient appears to be improving with his strength and steadiness of gait. Anticipate to discharge home within the next 1-2 days with home PT and appropriate services.  Duke Salvia Drue Second MD MPH [facility] [phone]  01/03/2012, 1:29 PM

## 2012-01-03 NOTE — Progress Notes (Signed)
At 0700 pt's heart rhythm was a fib. At 0830, pt was running NSR with PAC's and PVC's. Pt remains asymptomatic. According to note from 01/02/12, MD is aware of change in heart rhythm.

## 2012-01-03 NOTE — Progress Notes (Signed)
Clinical Social Work-CSW spoke with pt wife by phone re: d/c planning. Pt agreeable to SNF search and CSW will meet with both wife and pt to offer choice when wife arrives by taxi. Full assessment to follow in shadow chart-Dandrea Medders-MSW, [phone]

## 2012-01-03 NOTE — Progress Notes (Signed)
Physical Therapy Treatment Patient Details Name: Jeremy Sanders MRN: [id number] DOB: 1925-10-03 Today's Date: 01/03/2012  PT Assessment/Plan  PT - Assessment/Plan Comments on Treatment Session: Pt demonstrates safety and modified independence with most mobility and is functionally appropriate to return to home with supervison of his spouse and HHPT consult. Pt will benenfit from RW and 3 in 1 for home safety.  Acute PT will continue to follow pt to maximize safety and independence with mobility.   PT Plan: Discharge plan needs to be updated;Frequency needs to be updated;Equipment recommendations need to be updated PT Frequency: Min 2X/week Follow Up Recommendations: Home health PT Equipment Recommended: 3 in 1 bedside comode;Rolling walker with 5" wheels PT Goals  Acute Rehab PT Goals PT Goal Formulation: With patient/family Time For Goal Achievement: 2 weeks Pt will go Supine/Side to Sit: Independently PT Goal: Supine/Side to Sit - Progress: Met Pt will go Sit to Supine/Side: Independently PT Goal: Sit to Supine/Side - Progress: Met Pt will go Sit to Stand: with modified independence PT Goal: Sit to Stand - Progress: Met Pt will Ambulate: >150 feet;with modified independence;with least restrictive assistive device PT Goal: Ambulate - Progress: Progressing toward goal Pt will Go Up / Down Stairs: 3-5 stairs;with supervision;with least restrictive assistive device PT Goal: Up/Down Stairs - Progress: Met  PT Treatment Precautions/Restrictions  Precautions Precautions: Fall Required Braces or Orthoses: No Restrictions Weight Bearing Restrictions: No Mobility (including Balance) Bed Mobility Bed Mobility: Yes Supine to Sit: 7: Independent;HOB flat Sitting - Scoot to Edge of Bed: 7: Independent Sit to Supine: 7: Independent;HOB flat Transfers Transfers: Yes Sit to Stand: 7: Independent;With upper extremity assist;From bed Sit to Stand Details (indicate cue type and reason): no  cues or assist required.   Stand to Sit: 7: Independent;With armrests;To chair/3-in-1;With upper extremity assist Stand to Sit Details: No assist or instruction required.  Pt demonstrates safety.  Ambulation/Gait Ambulation/Gait: Yes Ambulation/Gait Assistance: 5: Supervision Ambulation/Gait Assistance Details (indicate cue type and reason): Verbal cues for proper use of RW.  Initially pt pushing walker too far ahead.  Ambulation Distance (Feet): 150 Feet Assistive device: Rolling walker Gait Pattern: Step-through pattern Gait velocity: WFL Stairs: Yes Stairs Assistance: 5: Supervision Stairs Assistance Details (indicate cue type and reason): Instructed pt in proper sequencing and technique for stair negotiation with RW and 1 rail.   Stair Management Technique: One rail Left;Forwards;With walker;Step to pattern Number of Stairs: 3  Height of Stairs: 6  Wheelchair Mobility Wheelchair Mobility: No  Posture/Postural Control Posture/Postural Control: No significant limitations Balance Balance Assessed: No Exercise    End of Session PT - End of Session Equipment Utilized During Treatment: Gait belt Activity Tolerance: Patient tolerated treatment well Patient left: in chair;with family/visitor present;with call bell in reach Nurse Communication: Mobility status for transfers;Mobility status for ambulation General Behavior During Session: [facility] for tasks performed Cognition: [facility] for tasks performed  Jeremy Sanders 01/03/2012, 2:01 PM Jeremy Sanders L. Jeremy Sanders DPT [phone]

## 2012-01-03 NOTE — Progress Notes (Signed)
Pt seen for eval. Per pt he was up and down all night walking in the room with the RW and took a shower in walk in shower by himself (they wanted to help me but I told them no)--does not make the safest judgements. Pt with IV now--have asked him not to get up without A with IV running--provided him a urinal to use. Feel pt would benefit from SNF OT to get stronger and safer before D/Cing home. If he refuses then will need 24/7 S/A, HHOT, and I would recommend at tub seat with back (preferably [facility] from [facility]) to increase safety in tub.  Will follow.  Jeremy Sanders, [place] [phone] 01/03/2012

## 2012-01-04 DIAGNOSIS — I10 Essential (primary) hypertension: Secondary | ICD-10-CM

## 2012-01-04 LAB — BASIC METABOLIC PANEL
BUN: 40 mg/dL — ABNORMAL HIGH (ref 6–23)
CO2: 23 mEq/L (ref 19–32)
Calcium: 8.8 mg/dL (ref 8.4–10.5)
Creatinine, Ser: 2.5 mg/dL — ABNORMAL HIGH (ref 0.50–1.35)
GFR calc non Af Amer: 22 mL/min — ABNORMAL LOW (ref >90)
Glucose, Bld: 128 mg/dL — ABNORMAL HIGH (ref 70–99)
Sodium: 142 mEq/L (ref 135–145)

## 2012-01-04 LAB — UREA NITROGEN, URINE: Urea Nitrogen, Ur: 405 mg/dL

## 2012-01-04 LAB — GLUCOSE, CAPILLARY: Glucose-Capillary: 170 mg/dL — ABNORMAL HIGH (ref 70–99)

## 2012-01-04 MED ORDER — METOPROLOL TARTRATE 12.5 MG HALF TABLET: 12.5000 mg | ORAL_TABLET | Freq: Two times a day (BID) | ORAL | Status: DC

## 2012-01-04 MED ORDER — AMLODIPINE BESYLATE 10 MG PO TABS: 10.0000 mg | ORAL_TABLET | Freq: Every day | ORAL | Status: DC

## 2012-01-04 MED ORDER — CLOPIDOGREL BISULFATE 75 MG PO TABS: 75.0000 mg | ORAL_TABLET | Freq: Every day | ORAL | Status: DC

## 2012-01-04 MED ORDER — AMLODIPINE BESYLATE 5 MG PO TABS: 5.0000 mg | ORAL_TABLET | Freq: Every day | ORAL | Status: DC

## 2012-01-04 NOTE — Progress Notes (Signed)
Discussed discharge instructions and medications with patient. Pt showed no barriers to learning. Assessment unchanged from morning. Pt and wife plan to take taxi home.

## 2012-01-04 NOTE — Progress Notes (Addendum)
Subjective: No events overnight, pt has been cleared by PT to get HHPT safely.  Objective: Vital signs in last 24 hours: Filed Vitals:   01/03/12 1643 01/03/12 1900 01/03/12 2041 01/04/12 0430  BP: 122/67  118/62 123/72  Pulse: 66  65 65  Temp: 97.7 F (36.5 C)  97.7 F (36.5 C) 97.7 F (36.5 C)  TempSrc: Oral  Oral Oral  Resp: 18  18   Height:      Weight:  219 lb 5.7 oz (99.5 kg)    SpO2: 94%  94% 96%   Weight change: -8 lb 9.6 oz (-3.9 kg)  Intake/Output Summary (Last 24 hours) at 01/04/12 1016 Last data filed at 01/04/12 0856  Gross per 24 hour  Intake   1922 ml  Output      0 ml  Net   1922 ml   Physical Exam: GEN: NAD. Alert and oriented x 3. Pleasant, conversant, and cooperative to exam.  RESP: CTAB, no w/r/r  CARDIOVASCULAR: irregular rhythm, S1, S2, no m/r/g  ABDOMEN: soft, NT/ND, NABS  EXT: warm and dry. Stable lymphedema  Lab Results: Basic Metabolic Panel:  Lab 01/04/12 6213 01/03/12 0620  NA 142 140  K 3.7 3.8  CL 109 107  CO2 23 24  GLUCOSE 128* 137*  BUN 40* 35*  CREATININE 2.50* 2.73*  CALCIUM 8.8 9.2  MG -- --  PHOS -- --   Liver Function Tests:  Lab 01/01/12 0937  AST 18  ALT 10  ALKPHOS 71  BILITOT 0.5  PROT 6.6  ALBUMIN 3.0*   CBC:  Lab 01/01/12 0937  WBC 7.1  NEUTROABS 3.6  HGB 13.6  HCT 41.6  MCV 90.4  PLT 193   Cardiac Enzymes:  Lab 01/01/12 0937  CKTOTAL 168  CKMB 4.8*  CKMBINDEX --  TROPONINI <0.30   CBG:  Lab 01/04/12 0754 01/03/12 2055 01/03/12 1645 01/03/12 1250  GLUCAP 170* 165* 312* 199*   Hemoglobin A1C:  Lab 01/01/12 1500  HGBA1C 7.2*   Fasting Lipid Panel:  Lab 01/01/12 1500  CHOL 111  HDL 64  LDLCALC 28  TRIG 97  CHOLHDL 1.7  LDLDIRECT --   Thyroid Function Tests:  Lab 01/01/12 1500  TSH 0.568  T4TOTAL --  FREET4 --  T3FREE --  THYROIDAB --   Coagulation:  Lab 01/01/12 0937  LABPROT 13.8  INR 1.04    Medications: I have reviewed the patient's current  medications. Scheduled Meds:    . amLODipine  10 mg Oral Daily  . clopidogrel  75 mg Oral Q breakfast  . enoxaparin  30 mg Subcutaneous Q24H  . insulin aspart  0-15 Units Subcutaneous TID WC  . metoprolol tartrate  12.5 mg Oral BID  . pantoprazole  40 mg Oral Q1200  . simvastatin  20 mg Oral QHS  . Tamsulosin HCl  0.4 mg Oral Daily  . DISCONTD: amLODipine  5 mg Oral Daily   Continuous Infusions:  PRN Meds:.nitroGLYCERIN, zolpidem Assessment/Plan: # CVA (cerebral infarction)  MRI shows acute infarct in the L midbrain and multiple prior infarcts in the b/l basal ganglia and cerebellum. Balance issues likely 2/2 this recent CVA and accumulation of prior insults. Pt doing better with PT today. Pt declines SNF.  - f/u carotid doppler, prelim shows no stenosis b/l  - 2-D echo, EF 50%, inadequate to assess wall motion  - telemetry  - plavix  - PT/OT  - consider anticoagulation, but has been falling a lot  - HHPT, walker  -  home safety eval   # AKI  Improved today, down to 2.5. - hold lasix, enalapril    # PAF  Pt fluctuates b/w a-fib and sinus with 1st degree AVB. Regardless, may need anticoagulation in future.  - metop 12.5 bid  - consider anticoagulation as outpatient once out of hospital and has PT done, can consider how unstable he is.   #HTN  Controlled, no longer orthostatic.  - holding lasix 2/2 aki  - decrease metop to 12.5 bid  - start norvasc 10mg   - Hold benazepril 2/2 aki   # Diabetes, type II  A1c 7.2.  - SSI while in hospital   # Hyperlipidemia  Lipids well controlled.  - con't zocor   # CAD  No CP, palp, SOB. Was on ASA 81mg . Switched to plavix   #Lymphedema  Chronic problem, will cont lasix. Has been improving over the past few months.  - per PMD notes, did not want lymphedema clinic   # HYPERTHYROIDISM  H/o hyperthyroidism, but TSH wnl  - NTD   #Ppx  - lovenox  - protonix   LOS: 3 days   Abner Greenspan, BEN 01/04/2012, 10:16  AM  Internal Medicine Teaching Service Attending Note Date: 01/04/2012  Patient name: Jeremy Sanders  Medical record number: [id number]  Date of birth: 1925-04-25    This patient has been seen and discussed with Dr. Abner Greenspan. Please see his note for complete details. I concur with his findings that Jeremy Sanders is ready for home with physical therapy;  with the following additions/corrections:   we will discharge patient on metoprolol 12.5mg  BID or equivalent for rate control on his paroxysmal atrial fibrillation. For blood pressure, his regimen will now be metoprolol and amlodipine 5mg  daily. He is to follow up with Dr. Aundria Rud, his PCP, to see if further changes need to be done. We have held his ace inhibitor and diuretic due to worsening acute kidney injury.  In regards to anticoagulation for atrial fibrillation, we will have the patient follow up with Dr. Aundria Rud. At this time, we have decided not to anticoagulate due to the fact that he presented with 2 ground level falls. He is on plavix for antiplatelet therapy.  Duke Salvia Drue Second MD MPH [facility] [phone]  01/04/2012, 11:40 AM

## 2012-01-04 NOTE — Discharge Summary (Signed)
I agree with the assessment and plan established by Dr. Abner Greenspan for Jeremy Sanders. Only correction is that Jeremy Sanders should be metoprolol 12.5mg  BID and amlodipine 5mg  daily for his hypertension. He was taken off of ace-inhibitor and diuretics due to worsening AKI. We will defer to Dr. Aundria Rud, his PCP, to re-establish these medications.

## 2012-01-04 NOTE — Discharge Summary (Signed)
Internal Medicine Teaching [facility] Discharge Note  Name: Jeremy Sanders MRN: [id number] DOB: 1925-08-24 78 y.o.  Date of Admission: 01/01/2012  9:14 AM Date of Discharge: 01/04/2012 Attending Physician: Judyann Munson, MD  Discharge Diagnosis: Principal Problem:  L midbrain CVA (cerebral infarction) Active Problems:  Paroxysmal atrial fibrillation  HYPERTHYROIDISM  DIABETES MELLITUS, TYPE II  HYPERLIPIDEMIA-MIXED  HYPERTENSION  CORONARY ARTERY DISEASE  Discharge Medications: Medication List  As of 01/04/2012 10:28 AM   STOP taking these medications         aspirin 81 MG tablet      benazepril 40 MG tablet      furosemide 40 MG tablet         TAKE these medications         amLODipine 10 MG tablet   Commonly known as: NORVASC   Take 1 tablet (10 mg total) by mouth daily.      clopidogrel 75 MG tablet   Commonly known as: PLAVIX   Take 1 tablet (75 mg total) by mouth daily with breakfast.      glipiZIDE 10 MG tablet   Commonly known as: GLUCOTROL   Take 10 mg by mouth 2 (two) times daily before a meal.      metoprolol tartrate 12.5 mg Tabs   Commonly known as: LOPRESSOR   Take 0.5 tablets (12.5 mg total) by mouth 2 (two) times daily.      nitroGLYCERIN 0.4 MG SL tablet   Commonly known as: NITROSTAT   Place 0.4 mg under the tongue every 5 (five) minutes as needed. For chest pain      omeprazole 20 MG capsule   Commonly known as: PRILOSEC   Take 20 mg by mouth every other day.      simvastatin 20 MG tablet   Commonly known as: ZOCOR   Take 1 tablet (20 mg total) by mouth at bedtime. .      Tamsulosin HCl 0.4 MG Caps   Commonly known as: FLOMAX   Take 0.4 mg by mouth daily.      traMADol-acetaminophen 37.5-325 MG per tablet   Commonly known as: ULTRACET   Take 1 tablet by mouth every 6 (six) hours as needed. For pain           Disposition and follow-up:   Jeremy Sanders was discharged from [facility] in Stable condition.     Follow-up Appointments: Follow-up Information    Follow up with Ulyess Mort, MD on 01/13/2012. (9:30)         Discharge Orders    Future Appointments: Provider: Department: Dept Phone: Center:   01/13/2012 9:30 AM Ulyess Mort, MD [facility] [phone] [facility]     Future Orders Please Complete By Expires   Diet - low sodium heart healthy      Increase activity slowly        PMD for followup: 1. Stroke: at followup the patient should be assessed for his gait and general functional recovery. He should be getting home health PT, and he should have a walker delivered. Make sure he is taking Plavix, a new medicine for him now.  2. paroxysmal atrial fibrillation: The patient should have a discussion with his PMD regarding whether he wants to go on anticoagulation or not. He was noted to be in and out of A. fib during this hospitalization.  3. acute kidney injury: Patient had mild AKI, and Lasix and ACE inhibitor were held. He should have a BMP checked, and  appropriate, these medicines can be restarted. Keep in mind, the patient was feeling quite orthostatic on admission, and he may have had too many antihypertensive medicines. His blood pressure was reasonably controlled at discharge was just metoprolol and amlodipine.   Consultations:  none  Procedures Performed:  Dg Chest 2 View  01/01/2012  *RADIOLOGY REPORT*  Clinical Data: Dizziness  CHEST - 2 VIEW  Comparison: 03/11/2004  Findings: Evidence of CABG noted.  Mild enlargement cardiomediastinal silhouette is noted.  No pleural effusion.  Mild flattening of hemidiaphragms is noted which may be seen with COPD but is nonspecific.  No compression deformity.  IMPRESSION: Cardiomegaly without focal acute finding.  Original Report Authenticated By: Harrel Lemon, M.D.   Ct Head Wo Contrast  01/01/2012  *RADIOLOGY REPORT*  Clinical Data: Weakness and dizziness  CT HEAD WITHOUT CONTRAST  Technique:  Contiguous axial images were  obtained from the base of the skull through the vertex without contrast.  Comparison: None.  Findings: Remote bilateral external capsule lacunar infarcts are noted. Cortical volume loss noted with proportional ventricular prominence.  Periventricular white matter hypodensity likely indicates small vessel ischemic change.  No acute hemorrhage, acute infarction, or mass lesion is identified.  No midline shift. Orbits and paranasal sinuses demonstrate mild ethmoid mucoperiosteal thickening but are otherwise unremarkable.  No skull fracture.  IMPRESSION: Mild ethmoid sinusitis.  No acute intracranial finding.  Chronic findings as above.  Original Report Authenticated By: Harrel Lemon, M.D.   Mr Angiogram Head Wo Contrast  01/01/2012  *RADIOLOGY REPORT*  Clinical Data:  Dizziness.  Weakness.  Recent falls.  MRI HEAD WITHOUT CONTRAST MRA HEAD WITHOUT CONTRAST  Technique:  Multiplanar, multiecho pulse sequences of the brain and surrounding structures were obtained without intravenous contrast. Angiographic images of the head were obtained using MRA technique without contrast.  Comparison:  CT head without contrast 01/01/2012.  MRI HEAD  Findings:  Areas of linear acute non hemorrhagic infarction are present within the inferior left mid brain.  T2 and FLAIR hyperintensity is associated.  No other acute infarct is present.  A remote hemorrhagic infarct is present in the inferior right cerebellum.  No acute hemorrhage is present.  Smaller remote lacunar infarcts are noted in the cerebellum and basal ganglia bilaterally.  Confluent periventricular white matter changes are present bilaterally.  A remote cortical infarct is present in the high posterior left parietal lobe.  Moderate generalized atrophy is evident.  Flow is present in the major intracranial arteries.  The globes and orbits are intact.  A polyp or mucous retention cyst is evident along the inferior aspect of the right maxillary sinus.  Mild mucosal  thickening is present in the left maxillary sinus.  Fluid is present in the right mastoid air cells.  IMPRESSION:  1.  Acute non hemorrhagic infarcts of the left mid brain. 2.  Remote hemorrhagic infarct of the right cerebellum. 3.  Multiple remote lacunar infarcts of the basal ganglia and cerebellum bilaterally. 4.  Advanced atrophy and extensive white matter disease likely reflects the sequelae of chronic microvascular ischemia. 5.  Mild sinus disease as described. 6.  Right mastoid effusion.  No obstructing nasopharyngeal lesion is evident.  MRA HEAD  Findings: Mild irregularity is present through the cavernous carotid arteries bilaterally without significant stenosis.  The A1 and M1 segments are within normal limits as well.  The MCA bifurcations are unremarkable.  Mild segmental attenuation is present in distal MCA and ACA branch vessels bilaterally.  The right vertebral artery  terminates at the PICA.  A tiny branch extends to the vertebral basilar junction.  The left vertebral artery is dominant.  The left PICA origin is visualized and normal. There is some irregularity in the proximal basilar artery, surrounding the AICA origin.  There is no significant stenosis relative to the distal vessel.  The posterior cerebral arteries both originate from basilar tip. There is some attenuation of distal PCA branch vessels bilaterally.  IMPRESSION:  1.  Moderate small vessel disease, most prominently in the posterior circulation. 2.  Mild irregularity within the cavernous carotid arteries and proximal basilar artery without significant stenosis relative to the distal vessels.  These results were called by telephone on 01/01/2012  at  12:30 pm to  Dr. Hughes Better, who verbally acknowledged these results.  Original Report Authenticated By: Jamesetta Orleans. MATTERN, M.D.   Mr Brain Wo Contrast  01/01/2012  *RADIOLOGY REPORT*  Clinical Data:  Dizziness.  Weakness.  Recent falls.  MRI HEAD WITHOUT CONTRAST MRA HEAD WITHOUT  CONTRAST  Technique:  Multiplanar, multiecho pulse sequences of the brain and surrounding structures were obtained without intravenous contrast. Angiographic images of the head were obtained using MRA technique without contrast.  Comparison:  CT head without contrast 01/01/2012.  MRI HEAD  Findings:  Areas of linear acute non hemorrhagic infarction are present within the inferior left mid brain.  T2 and FLAIR hyperintensity is associated.  No other acute infarct is present.  A remote hemorrhagic infarct is present in the inferior right cerebellum.  No acute hemorrhage is present.  Smaller remote lacunar infarcts are noted in the cerebellum and basal ganglia bilaterally.  Confluent periventricular white matter changes are present bilaterally.  A remote cortical infarct is present in the high posterior left parietal lobe.  Moderate generalized atrophy is evident.  Flow is present in the major intracranial arteries.  The globes and orbits are intact.  A polyp or mucous retention cyst is evident along the inferior aspect of the right maxillary sinus.  Mild mucosal thickening is present in the left maxillary sinus.  Fluid is present in the right mastoid air cells.  IMPRESSION:  1.  Acute non hemorrhagic infarcts of the left mid brain. 2.  Remote hemorrhagic infarct of the right cerebellum. 3.  Multiple remote lacunar infarcts of the basal ganglia and cerebellum bilaterally. 4.  Advanced atrophy and extensive white matter disease likely reflects the sequelae of chronic microvascular ischemia. 5.  Mild sinus disease as described. 6.  Right mastoid effusion.  No obstructing nasopharyngeal lesion is evident.  MRA HEAD  Findings: Mild irregularity is present through the cavernous carotid arteries bilaterally without significant stenosis.  The A1 and M1 segments are within normal limits as well.  The MCA bifurcations are unremarkable.  Mild segmental attenuation is present in distal MCA and ACA branch vessels bilaterally.  The  right vertebral artery terminates at the PICA.  A tiny branch extends to the vertebral basilar junction.  The left vertebral artery is dominant.  The left PICA origin is visualized and normal. There is some irregularity in the proximal basilar artery, surrounding the AICA origin.  There is no significant stenosis relative to the distal vessel.  The posterior cerebral arteries both originate from basilar tip. There is some attenuation of distal PCA branch vessels bilaterally.  IMPRESSION:  1.  Moderate small vessel disease, most prominently in the posterior circulation. 2.  Mild irregularity within the cavernous carotid arteries and proximal basilar artery without significant stenosis relative to the distal  vessels.  These results were called by telephone on 01/01/2012  at  12:30 pm to  Dr. Hughes Better, who verbally acknowledged these results.  Original Report Authenticated By: Jamesetta Orleans. MATTERN, M.D.    2D Echo:  - Left ventricle: The cavity size was normal. There was mild concentric hypertrophy. The estimated ejection fraction was 50%. - Atrial septum: No defect or patent foramen ovale was Identified  Admission HPI:  Patient is a very pleasant 78 year old man with history of diabetes, hypertension, hyperlipidemia, CAD who presents with 2 weeks of leg weakness, dizziness, and two falls. Patient says that he does not know what changed, but that recently he has felt as though his legs do not work well. He started using a cane, which she never used to use before. In terms of his dizziness, he says that he feels lightheaded when walking, but denies the room spinning around him. He has fallen twice, and his head gently each time. He says he feels dizzy when he gets up. Prior to 2 weeks ago he had never fallen. He denies any headache, chest pain, shortness of breath, palpitations, numbness/tingling/weakness in any limbs other than weakness in his legs. He denies melena or bright red blood per rectum. He  does not check his blood sugar at home.  Hospital Course by problem list: # CVA (cerebral infarction)  MRI showed acute infarct in the L midbrain and multiple prior infarcts in the b/l basal ganglia and cerebellum. Balance issues likely 2/2 this recent CVA and accumulation of prior insults.  TPA was not given because of unclear window, possibly multiple days.  Patient did well with physical therapy in the hospital, and was proved for home health PT. This was arranged for the patient. Carotid Dopplers and echocardiogram were unremarkable, so we continued with risk management for this patient.  He will start Plavix. He'll continue with blood pressure, lipid, and diabetes control.   # AKI  The patient had some mild injury, with a creatinine of 2.7, up from a baseline of around 2. He was given gentle fluids, and his Lasix and ACE inhibitor were held. His creatinine had decreased to 2.5 at the time of discharge. It should be rechecked at followup.   # PAF  Pt fluctuated b/w a-fib and sinus with 1st degree AVB. He showed a conversation with his PMD hurting the appropriateness of anticoagulation for him. He has been falling over the past 2 weeks, so we did not initiate it at this time. If he has a stable gait in the future and wants to, it could be considered.  #HTN  Patient was admitted initially with orthostatic vitals and symptoms. His Lasix and ACE inhibitor were held because of kidney injury, and his pressure was recently controlled with Norvasc and metoprolol. Additional agents can be added back once a BMP is checked. He was not orthostatic at the time of discharge.  # Diabetes, type II  A1c 7.2. Continue outpatient glipizide, and nearly at goal.  # Hyperlipidemia  Lipids well controlled. Continued zocor.  # CAD  No CP, palp, SOB. Was on ASA 81mg . Switched to plavix .  #Lymphedema  Chronic problem, will cont lasix. Has been improving over the past few months. Per PMD notes, did not want  lymphedema clinic. If he is interested in lymphedema clinic, he could go to either [facility] or [facility] for management.  # HYPERTHYROIDISM  H/o hyperthyroidism, but TSH wnl   Discharge Vitals:  BP 123/72  Pulse 65  Temp(Src) 97.7 F (36.5 C) (Oral)  Resp 18  Ht 5\' 9"  (1.753 m)  Wt 219 lb 5.7 oz (99.5 kg)  BMI 32.39 kg/m2  SpO2 96%  Discharge Labs:  Results for orders placed during the hospital encounter of 01/01/12 (from the past 24 hour(s))  UREA NITROGEN, URINE     Status: Normal   Collection Time   01/03/12 11:54 AM      Component Value Range   Urea Nitrogen, Ur 405    CREATININE, URINE, RANDOM     Status: Normal   Collection Time   01/03/12 11:54 AM      Component Value Range   Creatinine, Urine 81.66    GLUCOSE, CAPILLARY     Status: Abnormal   Collection Time   01/03/12 12:50 PM      Component Value Range   Glucose-Capillary 199 (*) 70 - 99 (mg/dL)  GLUCOSE, CAPILLARY     Status: Abnormal   Collection Time   01/03/12  4:45 PM      Component Value Range   Glucose-Capillary 312 (*) 70 - 99 (mg/dL)  GLUCOSE, CAPILLARY     Status: Abnormal   Collection Time   01/03/12  8:55 PM      Component Value Range   Glucose-Capillary 165 (*) 70 - 99 (mg/dL)  BASIC METABOLIC PANEL     Status: Abnormal   Collection Time   01/04/12  5:45 AM      Component Value Range   Sodium 142  135 - 145 (mEq/L)   Potassium 3.7  3.5 - 5.1 (mEq/L)   Chloride 109  96 - 112 (mEq/L)   CO2 23  19 - 32 (mEq/L)   Glucose, Bld 128 (*) 70 - 99 (mg/dL)   BUN 40 (*) 6 - 23 (mg/dL)   Creatinine, Ser 1.61 (*) 0.50 - 1.35 (mg/dL)   Calcium 8.8  8.4 - 09.6 (mg/dL)   GFR calc non Af Amer 22 (*) >90 (mL/min)   GFR calc Af Amer 26 (*) >90 (mL/min)  GLUCOSE, CAPILLARY     Status: Abnormal   Collection Time   01/04/12  7:54 AM      Component Value Range   Glucose-Capillary 170 (*) 70 - 99 (mg/dL)    SignedAbner Greenspan, BEN 01/04/2012, 10:28 AM

## 2012-01-05 ENCOUNTER — Telehealth: Payer: Self-pay | Admitting: *Deleted

## 2012-01-05 NOTE — Telephone Encounter (Signed)
Jeremy Sanders , a PT with [facility] called [phone] to let Dr. Drue Second know that PT started today. His bp is 144/86 & legs are swollen. He is not on lasix. She thinks he could benefit from OT. I told her Dr. Drue Second has not seen this pt in office & he has a pcp. She asked me to leave this message for Dr. Drue Second

## 2012-01-06 ENCOUNTER — Telehealth: Payer: Self-pay | Admitting: *Deleted

## 2012-01-06 NOTE — Telephone Encounter (Signed)
Verbal order for OT is approved.

## 2012-01-06 NOTE — Telephone Encounter (Signed)
VO given to PT 

## 2012-01-06 NOTE — Telephone Encounter (Signed)
Received a call Lanora Manis Physical Therapist with [facility] # [phone] called to let you know pt had a Stroke and has been having home health PT and she is requesting OT to help with shower activities.  This can be verbal order. Also wanted you to know that pt was taken off lasix while in the hospital, do to kidney problems, and he does have swelling to lower extremities. Pt has f/u in clinic on 2/15

## 2012-01-11 ENCOUNTER — Other Ambulatory Visit: Payer: Self-pay | Admitting: *Deleted

## 2012-01-11 ENCOUNTER — Other Ambulatory Visit: Payer: Self-pay | Admitting: Internal Medicine

## 2012-01-11 MED ORDER — FUROSEMIDE 40 MG PO TABS: 40.0000 mg | ORAL_TABLET | Freq: Every day | ORAL | Status: DC

## 2012-01-11 NOTE — Telephone Encounter (Signed)
Furosemide had been stopped in hospital lase week. He was in with a stroke and dizzyness.  Since d/c, PT has reported increasing edema of legs.  No increased SOB.  Will restart 40 of lasix a day and will see him in 2 days.

## 2012-01-11 NOTE — Telephone Encounter (Signed)
Call from [place], home health Physical Therapist with  [facility].  # [phone] She wanted to let you know pt's legs have increased edema, going up about 10 inches above ankles. He was taken off lasix while in hospital do to kidney problem.   Pt has appointment with Dr Aundria Rud on Friday.   PT states that at hospital d/c edema was about 5 inches above ankles.

## 2012-01-11 NOTE — Telephone Encounter (Signed)
PT Jeremy Sanders and pt called and informed. Pt has lasix at home and will take 40 mg once a day.

## 2012-01-13 ENCOUNTER — Encounter: Payer: Self-pay | Admitting: Internal Medicine

## 2012-01-13 ENCOUNTER — Ambulatory Visit (INDEPENDENT_AMBULATORY_CARE_PROVIDER_SITE_OTHER): Payer: Medicare Other | Admitting: Internal Medicine

## 2012-01-13 DIAGNOSIS — I10 Essential (primary) hypertension: Secondary | ICD-10-CM

## 2012-01-13 DIAGNOSIS — R609 Edema, unspecified: Secondary | ICD-10-CM

## 2012-01-13 LAB — BASIC METABOLIC PANEL
BUN: 44 mg/dL — ABNORMAL HIGH (ref 6–23)
Potassium: 5 mEq/L (ref 3.5–5.3)
Sodium: 140 mEq/L (ref 135–145)

## 2012-01-13 MED ORDER — FUROSEMIDE 40 MG PO TABS: 40.0000 mg | ORAL_TABLET | Freq: Every day | ORAL | Status: DC

## 2012-01-13 MED ORDER — BENAZEPRIL HCL 20 MG PO TABS: 20.0000 mg | ORAL_TABLET | Freq: Every day | ORAL | Status: DC

## 2012-01-13 NOTE — Progress Notes (Signed)
34 man with vascular risk factors, admitted 2 weeks ago for subacute dizzyness and falling.  Found to have several posterior strokes of different ages.  Initial BP was slightly lower than his usual and his creatinine was higher than before (had been around 2.0 in 2012 and was 2.7 on admission.  Lasix and benazepril were held.  Creat fell to 2.5 and BP returned to baseline -- orthostatic drop cleared.  He was discharged with PT. Two days ago, at request of home PT, I restarted his lasix at 40 mg a day.  He has been elevating his legs day and night.  BP today = 146/78.  He denies chest pain or orthopnea but has massive edema.  Known to have 4 grams of proteinuria and serum alb of 3.0. Dizzyness is gone.  He still feels that his legs are weak.  No upper body sx.  Eating well.  Weight is down 20 lbs from 2012 and before:  218 vs 235. Lungs clear.  Cor is regular with occ ectopic. In hospital, he had intermittent afib.  His longtime pattern before was sinus with frequent PVC.  Question of anti-coagulation was deferred to this visit.  Plavix was substituted for aspirin. Plan: 1.)  Will continue lasix at 40 a day and restart benazepril at lower dose of 20 a day. 2.)  Continue PT. 3.)  Will not risk anti-coagulation yet.  He is in a regular rhythm now and newly on plavix.  He has fallen several times at home.  He is very unenthusiastic about extensive medical adventures (in which category I would place anti-coagulation).   4.)  He expresses some depressive thoughts but no thoughts of self-harm.  he is good-humored but realistic about his serious health problems.   5.)  Regarding his DM:  A1c has been well controlled and his LDL is very low (I am not sure I believe the last one of 28!)  His HDL is unusually high at 64.  These probably do not provide much direction.  He has been to eye exams -- will document recent visits at his next visit here.  He still sees a Warden/ranger every 6 months.

## 2012-01-26 ENCOUNTER — Telehealth: Payer: Self-pay | Admitting: *Deleted

## 2012-01-26 NOTE — Telephone Encounter (Signed)
PT to do home therapy visits 3 times weekly for 2 further weeks.

## 2012-01-26 NOTE — Telephone Encounter (Signed)
Contacted Home Health physical therapist Lanora Manis) at [phone] and left verbal ok for additional physical therapy.  Will send to md for signature.Jeremy Spittle Cassady2/28/201312:56 PM

## 2012-01-26 NOTE — Telephone Encounter (Signed)
Home PT, calls to ask for extended visits w/ pt. She would like to see pt 3 times weekly x 2 weeks, i can give her a verbal ok, may i?

## 2012-02-17 ENCOUNTER — Ambulatory Visit (INDEPENDENT_AMBULATORY_CARE_PROVIDER_SITE_OTHER): Payer: Medicare Other | Admitting: Internal Medicine

## 2012-02-17 ENCOUNTER — Encounter: Payer: Self-pay | Admitting: Internal Medicine

## 2012-02-17 VITALS — BP 170/90 | HR 61 | Temp 98.4°F | Wt 226.5 lb

## 2012-02-17 DIAGNOSIS — I10 Essential (primary) hypertension: Secondary | ICD-10-CM

## 2012-02-17 LAB — BASIC METABOLIC PANEL
Chloride: 107 mEq/L (ref 96–112)
Glucose, Bld: 129 mg/dL — ABNORMAL HIGH (ref 70–99)
Potassium: 4.7 mEq/L (ref 3.5–5.3)
Sodium: 139 mEq/L (ref 135–145)

## 2012-02-17 MED ORDER — ASPIRIN-DIPYRIDAMOLE ER 25-200 MG PO CP12: 1.0000 | ORAL_CAPSULE | Freq: Two times a day (BID) | ORAL | Status: DC

## 2012-02-17 NOTE — Progress Notes (Signed)
77 man with HTN, DM and nephrotic syndrome.  Has massive edema.  Hospitalized 2 mos ago for dizzyness and found to have several old posterior strokes.  Lasix and benazepril held but lasix restarted later after discharge.   Today he feels very well.  Says he has no pain or dyspnea, specifically no orthopnea.  Has massive edema and weight is up about 10 lbs.  He admits to not taking lasix most days.  Also stopped plavix from belief that it was causing diarrhea; diarrhea has now stopped.  Will change to aggrenox bid.  His BP is very hard to measure as his pulse is irregular.  I believe it is close to 170/80.  The best approach will be for him to actually take his lasix.

## 2012-06-08 ENCOUNTER — Ambulatory Visit (INDEPENDENT_AMBULATORY_CARE_PROVIDER_SITE_OTHER): Payer: Medicare Other | Admitting: Internal Medicine

## 2012-06-08 ENCOUNTER — Encounter: Payer: Self-pay | Admitting: Internal Medicine

## 2012-06-08 VITALS — BP 157/72 | HR 56 | Temp 96.6°F | Wt 213.3 lb

## 2012-06-08 DIAGNOSIS — E119 Type 2 diabetes mellitus without complications: Secondary | ICD-10-CM

## 2012-06-08 DIAGNOSIS — E0590 Thyrotoxicosis, unspecified without thyrotoxic crisis or storm: Secondary | ICD-10-CM

## 2012-06-08 LAB — BASIC METABOLIC PANEL WITH GFR
BUN: 39 mg/dL — ABNORMAL HIGH (ref 6–23)
CO2: 25 meq/L (ref 19–32)
Calcium: 8.6 mg/dL (ref 8.4–10.5)
Chloride: 109 meq/L (ref 96–112)
Creat: 2.06 mg/dL — ABNORMAL HIGH (ref 0.50–1.35)
Glucose, Bld: 130 mg/dL — ABNORMAL HIGH (ref 70–99)
Potassium: 3.9 meq/L (ref 3.5–5.3)
Sodium: 142 meq/L (ref 135–145)

## 2012-06-08 LAB — POCT GLYCOSYLATED HEMOGLOBIN (HGB A1C): Hemoglobin A1C: 6.9

## 2012-06-08 NOTE — Assessment & Plan Note (Addendum)
Stable.  A1c is 6.9 on glipizide.  Has nephrotic proteinuria and slowly advancing RF.  SBP is 157 despite 13 lb drop.  He will not take lasix which would be the most appropriate addition to BP regimen.  Perhaps further wgt loss will achieve the same result.  He is deliberately eating less food and salt.  App't with Dr. Elmer Picker (eye) next week.  Lungs clear.  Cor regular.  No carotid bruits.  Massive chronic edema.

## 2012-06-08 NOTE — Assessment & Plan Note (Signed)
Labs in March OK.  No meds.

## 2012-06-08 NOTE — Progress Notes (Signed)
40 man with several chronic ills.  Doing very well today.

## 2012-09-14 ENCOUNTER — Encounter: Payer: Self-pay | Admitting: Internal Medicine

## 2012-09-14 ENCOUNTER — Other Ambulatory Visit: Payer: Self-pay | Admitting: Internal Medicine

## 2012-09-14 ENCOUNTER — Ambulatory Visit (INDEPENDENT_AMBULATORY_CARE_PROVIDER_SITE_OTHER): Payer: Medicare Other | Admitting: Internal Medicine

## 2012-09-14 VITALS — BP 175/84 | HR 60 | Temp 97.7°F | Ht 69.0 in | Wt 221.2 lb

## 2012-09-14 DIAGNOSIS — R809 Proteinuria, unspecified: Secondary | ICD-10-CM

## 2012-09-14 DIAGNOSIS — I2510 Atherosclerotic heart disease of native coronary artery without angina pectoris: Secondary | ICD-10-CM

## 2012-09-14 DIAGNOSIS — I639 Cerebral infarction, unspecified: Secondary | ICD-10-CM

## 2012-09-14 DIAGNOSIS — I10 Essential (primary) hypertension: Secondary | ICD-10-CM

## 2012-09-14 DIAGNOSIS — E119 Type 2 diabetes mellitus without complications: Secondary | ICD-10-CM

## 2012-09-14 DIAGNOSIS — I6350 Cerebral infarction due to unspecified occlusion or stenosis of unspecified cerebral artery: Secondary | ICD-10-CM

## 2012-09-14 DIAGNOSIS — Z23 Encounter for immunization: Secondary | ICD-10-CM

## 2012-09-14 LAB — GLUCOSE, CAPILLARY: Glucose-Capillary: 120 mg/dL — ABNORMAL HIGH (ref 70–99)

## 2012-09-14 LAB — LIPID PANEL
Cholesterol: 155 mg/dL (ref 0–200)
HDL: 67 mg/dL (ref >39)
Total CHOL/HDL Ratio: 2.3 Ratio

## 2012-09-14 MED ORDER — BENAZEPRIL HCL 20 MG PO TABS: 20.0000 mg | ORAL_TABLET | Freq: Every day | ORAL | Status: DC

## 2012-09-14 MED ORDER — ASPIRIN-DIPYRIDAMOLE ER 25-200 MG PO CP12: 1.0000 | ORAL_CAPSULE | Freq: Two times a day (BID) | ORAL | Status: DC

## 2012-09-14 MED ORDER — METOPROLOL TARTRATE 12.5 MG HALF TABLET: 12.5000 mg | ORAL_TABLET | Freq: Two times a day (BID) | ORAL | Status: DC

## 2012-09-14 MED ORDER — TAMSULOSIN HCL 0.4 MG PO CAPS: 0.4000 mg | ORAL_CAPSULE | Freq: Every day | ORAL | Status: DC

## 2012-09-14 NOTE — Assessment & Plan Note (Signed)
No new CNS events.  Had stopped aggrenox due to misunderstanding (probably from time plavix was stopped).  Advised him to re-start aggrenox.

## 2012-09-14 NOTE — Assessment & Plan Note (Signed)
A1c = 7.  Wgt up due to edema. Sees Dr. Elmer Picker -- eye. Repeat lipid as last LDL was absurdly low!

## 2012-09-14 NOTE — Assessment & Plan Note (Signed)
App't with cardiologist in 2 weeks.  Cor regular today w/o murmur.  Lungs clear.  No bruit.

## 2012-09-14 NOTE — Progress Notes (Signed)
70 man with several problems on Rx.  Also sees eye doctor and cardiologist.

## 2012-09-14 NOTE — Assessment & Plan Note (Addendum)
Massive proteinuria and leg edema.  On benazepril.

## 2012-09-14 NOTE — Assessment & Plan Note (Signed)
Systolic up today.  Has not been taking lasix.  Says he will try again.

## 2012-09-27 ENCOUNTER — Ambulatory Visit: Payer: Medicare Other | Admitting: Cardiovascular Disease

## 2012-10-11 ENCOUNTER — Other Ambulatory Visit: Payer: Self-pay | Admitting: Internal Medicine

## 2012-10-15 ENCOUNTER — Telehealth: Payer: Self-pay | Admitting: *Deleted

## 2012-10-15 NOTE — Telephone Encounter (Signed)
Wife states they need a refill on Benazepril - [facility] [address] and [address] states Rx was picked up either 10/05/12 or 10/06/12 # 100. [facility] not [facility]/11/12 since they just picked up Benazepril earlier in Nov for #100. Suggest to take bottles to [facility] or come to clinic to check on first Rx. Stanton Kidney Dragan Tamburrino RN 10/15/12 12:15PM

## 2012-10-16 NOTE — Telephone Encounter (Signed)
Talked with wife 10/16/12 1PM - ok on medication. Stanton Kidney Yamina Lenis RN 10/16/12 1:15PM

## 2012-11-01 ENCOUNTER — Ambulatory Visit: Payer: Medicare Other | Admitting: Cardiovascular Disease

## 2012-12-10 ENCOUNTER — Other Ambulatory Visit: Payer: Self-pay | Admitting: *Deleted

## 2012-12-10 DIAGNOSIS — E119 Type 2 diabetes mellitus without complications: Secondary | ICD-10-CM

## 2012-12-10 MED ORDER — FUROSEMIDE 40 MG PO TABS: 40.0000 mg | ORAL_TABLET | Freq: Every day | ORAL | Status: DC

## 2012-12-10 MED ORDER — SIMVASTATIN 20 MG PO TABS: 20.0000 mg | ORAL_TABLET | Freq: Every day | ORAL | Status: DC

## 2012-12-10 MED ORDER — GLIPIZIDE 10 MG PO TABS: 10.0000 mg | ORAL_TABLET | Freq: Two times a day (BID) | ORAL | Status: DC

## 2012-12-10 MED ORDER — AMLODIPINE BESYLATE 5 MG PO TABS: 5.0000 mg | ORAL_TABLET | Freq: Every day | ORAL | Status: DC

## 2012-12-10 NOTE — Telephone Encounter (Signed)
Pls sch new PCP or any MD in 30 days.

## 2012-12-10 NOTE — Telephone Encounter (Signed)
Pt here with wife, requests refill on medications.  He gets hard copy.Jeremy Spittle Cassady1/13/20142:15 PM

## 2012-12-10 NOTE — Telephone Encounter (Signed)
Last seen Oct. Pls sch with new PCP or any MD in next 30 days.

## 2013-01-18 ENCOUNTER — Encounter: Payer: Self-pay | Admitting: Internal Medicine

## 2013-01-18 ENCOUNTER — Ambulatory Visit (INDEPENDENT_AMBULATORY_CARE_PROVIDER_SITE_OTHER): Payer: Medicare Other | Admitting: Internal Medicine

## 2013-01-18 VITALS — BP 174/89 | HR 66 | Temp 98.1°F | Ht 69.0 in | Wt 220.7 lb

## 2013-01-18 DIAGNOSIS — I10 Essential (primary) hypertension: Secondary | ICD-10-CM

## 2013-01-18 DIAGNOSIS — N259 Disorder resulting from impaired renal tubular function, unspecified: Secondary | ICD-10-CM

## 2013-01-18 DIAGNOSIS — E119 Type 2 diabetes mellitus without complications: Secondary | ICD-10-CM

## 2013-01-18 DIAGNOSIS — I6350 Cerebral infarction due to unspecified occlusion or stenosis of unspecified cerebral artery: Secondary | ICD-10-CM

## 2013-01-18 DIAGNOSIS — I639 Cerebral infarction, unspecified: Secondary | ICD-10-CM

## 2013-01-18 LAB — BASIC METABOLIC PANEL
BUN: 40 mg/dL — ABNORMAL HIGH (ref 6–23)
CO2: 24 mEq/L (ref 19–32)
Chloride: 107 mEq/L (ref 96–112)
Creat: 2.44 mg/dL — ABNORMAL HIGH (ref 0.50–1.35)

## 2013-01-18 LAB — POCT GLYCOSYLATED HEMOGLOBIN (HGB A1C): Hemoglobin A1C: 7.2

## 2013-01-18 MED ORDER — ASPIRIN-DIPYRIDAMOLE ER 25-200 MG PO CP12: 1.0000 | ORAL_CAPSULE | Freq: Two times a day (BID) | ORAL | Status: DC

## 2013-01-18 MED ORDER — GLIPIZIDE 10 MG PO TABS: 10.0000 mg | ORAL_TABLET | Freq: Two times a day (BID) | ORAL | Status: DC

## 2013-01-18 MED ORDER — SIMVASTATIN 20 MG PO TABS: 20.0000 mg | ORAL_TABLET | Freq: Every day | ORAL | Status: DC

## 2013-01-18 MED ORDER — AMLODIPINE BESYLATE 10 MG PO TABS: 10.0000 mg | ORAL_TABLET | Freq: Every day | ORAL | Status: DC

## 2013-01-18 NOTE — Progress Notes (Signed)
62 man with DM, HTN, vascular disease and nephrotix proteinuria.  He geels well -- "100%" -- today.

## 2013-01-18 NOTE — Assessment & Plan Note (Signed)
Last creatinine 2.06; actually lower than in 2012.  4000 mg proteinuria.

## 2013-01-18 NOTE — Assessment & Plan Note (Addendum)
A1c = 7.2. Wgt unchanged at 220.  Appetite good. Lipids excellent.  Nephrotic proteinuria

## 2013-01-18 NOTE — Assessment & Plan Note (Signed)
This was a spell of bilat leg weakness in January 2013 without residua or recurrence.

## 2013-01-18 NOTE — Assessment & Plan Note (Addendum)
SBP 175, same as last time.  Unlikely to improve without lasix which he finds too uncomfortable to take.  Will increase amlodipine dose to 10/day/ No chest pain or orthopnea. Has annual app't with cardiologist; next in March. Cor irregular as usual [known to have frequent PVCs].  No murmur. Lungs clear.  Massive edema.  Will repeat Bmet today.  Will see him again in 4 months.

## 2013-02-16 ENCOUNTER — Other Ambulatory Visit: Payer: Self-pay | Admitting: Internal Medicine

## 2013-02-16 MED ORDER — TAMSULOSIN HCL 0.4 MG PO CAPS: 0.4000 mg | ORAL_CAPSULE | Freq: Every day | ORAL | Status: DC

## 2013-02-18 ENCOUNTER — Encounter: Payer: Self-pay | Admitting: Internal Medicine

## 2013-02-18 ENCOUNTER — Ambulatory Visit (INDEPENDENT_AMBULATORY_CARE_PROVIDER_SITE_OTHER): Payer: Medicare Other | Admitting: Internal Medicine

## 2013-02-18 VITALS — BP 150/85 | HR 65 | Wt 222.8 lb

## 2013-02-18 DIAGNOSIS — R300 Dysuria: Secondary | ICD-10-CM

## 2013-02-18 LAB — POCT URINALYSIS DIPSTICK
Spec Grav, UA: >=1.03
Urobilinogen, UA: 0.2

## 2013-02-18 NOTE — Progress Notes (Signed)
68 man with chronic voiding difficulty, usually improved on flomax.  He ran out of flomax for a few days and thought he had dysuria.  I refilled his flomax yesterday and his sx have already resolved.  UA has glucose and protein as usual but no LE or nitrite.  Jeremy Sanders has no other complaints.  I encouraged him to make app't to see his cardiologist and to re-consider having his cataracts removed (as has been suggested by Dr. Elmer Picker).

## 2013-03-27 ENCOUNTER — Encounter: Payer: Self-pay | Admitting: Cardiovascular Disease

## 2013-03-27 ENCOUNTER — Ambulatory Visit (INDEPENDENT_AMBULATORY_CARE_PROVIDER_SITE_OTHER): Payer: Medicare Other | Admitting: Cardiovascular Disease

## 2013-03-27 VITALS — BP 150/88 | HR 64 | Ht 69.0 in | Wt 224.8 lb

## 2013-03-27 DIAGNOSIS — E785 Hyperlipidemia, unspecified: Secondary | ICD-10-CM

## 2013-03-27 DIAGNOSIS — I25810 Atherosclerosis of coronary artery bypass graft(s) without angina pectoris: Secondary | ICD-10-CM

## 2013-03-27 NOTE — Patient Instructions (Addendum)
Your physician wants you to follow-up in:  12 months.  You will receive a reminder letter in the mail two months in advance. If you don't receive a letter, please call our office to schedule the follow-up appointment.   

## 2013-03-27 NOTE — Progress Notes (Signed)
History of Present Illness: 79 yo WM with history of CAD, HTN, HLD, DM, CRI, venous insufficiency here today for cardiac follow up. He has been followed in the past by Dr. Juanda Chance. He had bypass surgery in 1997. He underwent cardiac catheterization in 2004 and his LIMA graft was okay and the saphenous vein graft to the circumflex artery and PL branch was patent. The report indicates that a Ramus branch also filled from this graft. There was severe distal disease in the LAD beyond the graft insertion and in the mid LAD proximal to the graft insertion. He also has chronic renal insufficiency with creatinines in the range of 1.8.   He is here today for follow up. He's had no chest pain, SOB or palpitations. He has difficulty walking and more trouble with his balance and weakness. All pedal ulcers have healed. He has chronic lower ext edema but has not been taking his Lasix because he does not like frequent urination.   Primary Care Physician: Ulyess Mort  Last Lipid Profile: Followed in primary care.     Component Value Date/Time   CHOL 155 09/14/2012 1233   TRIG 67 09/14/2012 1233   HDL 67 09/14/2012 1233   CHOLHDL 2.3 09/14/2012 1233   VLDL 13 09/14/2012 1233   LDLCALC 75 09/14/2012 1233     Past Medical History  Diagnosis Date  . Diabetes mellitus   . Hypertension   . Hyperlipidemia   . Cholelithiasis     Dr. Matthias Hughs  . CAD (coronary artery disease)     s/p CABG in 1997. Dr. Juanda Chance.  . Venous insufficiency of leg   . BPH (benign prostatic hyperplasia)     Dr. Isabel Caprice  . Eosinophilia 2005    Since 2005  . Diverticulosis 2005    GI bleed 02/2004  . Insomnia   . Chronic kidney disease     Cr around 2 since 2005  . Hyperthyroidism     persistently low TSH. RAI treatement in 2002. Reffered to Dr. Ardyth Harps in 2007.  Marland Kitchen Proteinuria 2007    Past Surgical History  Procedure Laterality Date  . Coronary artery bypass graft      1997    Current Outpatient Prescriptions    Medication Sig Dispense Refill  . acetaminophen (TYLENOL) 325 MG tablet Take 650 mg by mouth as needed for pain.      Marland Kitchen amLODipine (NORVASC) 10 MG tablet Take 1 tablet (10 mg total) by mouth daily.  100 tablet  3  . aspirin 81 MG tablet Take 81 mg by mouth daily.      . benazepril (LOTENSIN) 20 MG tablet TAKE 1 TABLET BY MOUTH ONCE DAILY  100 tablet  3  . cholecalciferol (VITAMIN D) 400 UNITS TABS Take 1,000 Units by mouth daily.      Marland Kitchen dipyridamole-aspirin (AGGRENOX) 200-25 MG per 12 hr capsule Take 1 capsule by mouth 2 (two) times daily.  200 capsule  3  . furosemide (LASIX) 40 MG tablet Take 1 tablet (40 mg total) by mouth daily.  100 tablet  0  . glipiZIDE (GLUCOTROL) 10 MG tablet Take 1 tablet (10 mg total) by mouth 2 (two) times daily before a meal.  200 tablet  3  . metoprolol tartrate (LOPRESSOR) 12.5 mg TABS Take 0.5 tablets (12.5 mg total) by mouth 2 (two) times daily.  100 tablet  3  . nitroGLYCERIN (NITROSTAT) 0.4 MG SL tablet Place 0.4 mg under the tongue every 5 (five) minutes as needed.  For chest pain      . omeprazole (PRILOSEC) 20 MG capsule Take 20 mg by mouth every other day.       . simvastatin (ZOCOR) 20 MG tablet Take 1 tablet (20 mg total) by mouth at bedtime. .  100 tablet  3  . tamsulosin (FLOMAX) 0.4 MG CAPS Take 1 capsule (0.4 mg total) by mouth daily.  100 capsule  3  . tramadol-acetaminophen (ULTRACET) 37.5-325 MG per tablet Take 1 tablet by mouth every 6 (six) hours as needed. For pain       No current facility-administered medications for this visit.    No Known Allergies  History   Social History  . Marital Status: Married    Spouse Name: N/A    Number of Children: N/A  . Years of Education: N/A   Occupational History  . Not on file.   Social History Main Topics  . Smoking status: Former Games developer  . Smokeless tobacco: Not on file  . Alcohol Use: Not on file  . Drug Use: Not on file  . Sexually Active: Not on file   Other Topics Concern  . Not on  file   Social History Narrative   Originally from [place]. Has been a world traveler.    Unclear about his career by appears its a comfortable retirement with his wife in [place].     No family history on file.  Review of Systems:  As stated in the HPI and otherwise negative.   BP 150/88  Pulse 64  Ht 5\' 9"  (1.753 m)  Wt 224 lb 12.8 oz (101.969 kg)  BMI 33.18 kg/m2  Physical Examination: General: Well developed, well nourished, NAD HEENT: OP clear, mucus membranes moist SKIN: warm, dry. No rashes. Neuro: No focal deficits Musculoskeletal: Muscle strength 5/5 all ext Psychiatric: Mood and affect normal Neck: No JVD, no carotid bruits, no thyromegaly, no lymphadenopathy. Lungs:Clear bilaterally, no wheezes, rhonci, crackles Cardiovascular: Regular rate and rhythm. No murmurs, gallops or rubs. Abdomen:Soft. Bowel sounds present. Non-tender.  Extremities: No lower extremity edema. Pulses are 2 + in the bilateral DP/PT.  EKG:  Assessment and Plan:   1. CORONARY ARTERY DISEASE:  Stable. No changes.     2. HYPERLIPIDEMIA: He is on a statin. Lipids followed in primary care.

## 2013-04-08 ENCOUNTER — Other Ambulatory Visit: Payer: Self-pay | Admitting: Internal Medicine

## 2013-04-08 DIAGNOSIS — E119 Type 2 diabetes mellitus without complications: Secondary | ICD-10-CM

## 2013-04-08 MED ORDER — SIMVASTATIN 20 MG PO TABS: 20.0000 mg | ORAL_TABLET | Freq: Every day | ORAL | Status: DC

## 2013-04-08 NOTE — Progress Notes (Signed)
Pt's wife requested refill for mail-order delivery. Printed Rx.  Discussed with Dr. Dalphine Handing

## 2013-04-25 ENCOUNTER — Other Ambulatory Visit: Payer: Self-pay | Admitting: Internal Medicine

## 2013-04-25 DIAGNOSIS — E119 Type 2 diabetes mellitus without complications: Secondary | ICD-10-CM

## 2013-04-25 MED ORDER — GLIPIZIDE 10 MG PO TABS: 10.0000 mg | ORAL_TABLET | Freq: Two times a day (BID) | ORAL | Status: DC

## 2013-06-06 ENCOUNTER — Other Ambulatory Visit: Payer: Self-pay

## 2013-06-24 ENCOUNTER — Ambulatory Visit (INDEPENDENT_AMBULATORY_CARE_PROVIDER_SITE_OTHER): Payer: Medicare Other | Admitting: Internal Medicine

## 2013-06-24 VITALS — BP 134/84 | HR 75 | Temp 97.8°F | Wt 228.3 lb

## 2013-06-24 DIAGNOSIS — I10 Essential (primary) hypertension: Secondary | ICD-10-CM

## 2013-06-24 DIAGNOSIS — I639 Cerebral infarction, unspecified: Secondary | ICD-10-CM

## 2013-06-24 DIAGNOSIS — E0590 Thyrotoxicosis, unspecified without thyrotoxic crisis or storm: Secondary | ICD-10-CM

## 2013-06-24 DIAGNOSIS — I6350 Cerebral infarction due to unspecified occlusion or stenosis of unspecified cerebral artery: Secondary | ICD-10-CM

## 2013-06-24 DIAGNOSIS — E785 Hyperlipidemia, unspecified: Secondary | ICD-10-CM

## 2013-06-24 DIAGNOSIS — E119 Type 2 diabetes mellitus without complications: Secondary | ICD-10-CM

## 2013-06-24 DIAGNOSIS — F329 Major depressive disorder, single episode, unspecified: Secondary | ICD-10-CM

## 2013-06-24 DIAGNOSIS — I2510 Atherosclerotic heart disease of native coronary artery without angina pectoris: Secondary | ICD-10-CM

## 2013-06-24 LAB — POCT GLYCOSYLATED HEMOGLOBIN (HGB A1C): Hemoglobin A1C: 6.8

## 2013-06-24 LAB — GLUCOSE, CAPILLARY: Glucose-Capillary: 225 mg/dL — ABNORMAL HIGH (ref 70–99)

## 2013-06-24 MED ORDER — GLIPIZIDE 10 MG PO TABS: 10.0000 mg | ORAL_TABLET | Freq: Two times a day (BID) | ORAL | Status: DC

## 2013-06-24 MED ORDER — METOPROLOL TARTRATE 12.5 MG HALF TABLET: 12.5000 mg | ORAL_TABLET | Freq: Two times a day (BID) | ORAL | Status: DC

## 2013-06-24 MED ORDER — BENAZEPRIL HCL 20 MG PO TABS: ORAL_TABLET | ORAL | Status: DC

## 2013-06-24 MED ORDER — OMEPRAZOLE 20 MG PO CPDR: 20.0000 mg | DELAYED_RELEASE_CAPSULE | ORAL | Status: DC

## 2013-06-24 MED ORDER — SIMVASTATIN 20 MG PO TABS: 20.0000 mg | ORAL_TABLET | Freq: Every day | ORAL | Status: DC

## 2013-06-24 MED ORDER — TAMSULOSIN HCL 0.4 MG PO CAPS: 0.4000 mg | ORAL_CAPSULE | Freq: Every day | ORAL | Status: DC

## 2013-06-24 MED ORDER — FUROSEMIDE 40 MG PO TABS: ORAL_TABLET | ORAL | Status: DC

## 2013-06-24 MED ORDER — ASPIRIN-DIPYRIDAMOLE ER 25-200 MG PO CP12: 1.0000 | ORAL_CAPSULE | Freq: Two times a day (BID) | ORAL | Status: DC

## 2013-06-24 MED ORDER — AMLODIPINE BESYLATE 10 MG PO TABS: 10.0000 mg | ORAL_TABLET | Freq: Every day | ORAL | Status: DC

## 2013-06-24 NOTE — Assessment & Plan Note (Signed)
Will update lab.  Unclear if this is actually much of a problem.

## 2013-06-24 NOTE — Assessment & Plan Note (Signed)
He brings this up as his only active problem but does not want to take a new medicine.  As in the past, his cheerful manner belies his complaint.  He may, in fact, have anhedonia but mostly this reflects decreased social opportunities and decreased mobility.  He has a very good wife and he appreciates her.  He is actively planning disposition of his estate for nephews overseas, which is difficult.

## 2013-06-24 NOTE — Progress Notes (Signed)
32 man with several chronic problems.  In good spirits today.    Problems updated below.  Only change today was advice to increase lasix 40 to 80.  He will likely not do this. Also advised him to consider cataract surgery if advised by his eye doctor.

## 2013-06-24 NOTE — Assessment & Plan Note (Signed)
Excellent today despite rise in weight.  No orthopnea or chest pain.  Saw cardiologist in May.

## 2013-06-24 NOTE — Assessment & Plan Note (Signed)
Labs consistently normal.

## 2013-06-24 NOTE — Assessment & Plan Note (Signed)
A1c has been stable around 7.  On glipizide only.  Massive proteinuria and edema -- no change there.  Creatinine 2.5 but rising very slowly over years.

## 2013-06-24 NOTE — Assessment & Plan Note (Signed)
LDL 75 on zocor.

## 2013-06-25 LAB — CBC WITH DIFFERENTIAL/PLATELET
Basophils Absolute: 0 10*3/uL (ref 0.0–0.1)
Basophils Relative: 1 % (ref 0–1)
Eosinophils Relative: 8 % — ABNORMAL HIGH (ref 0–5)
HCT: 39.3 % (ref 39.0–52.0)
MCH: 31 pg (ref 26.0–34.0)
MCHC: 33.3 g/dL (ref 30.0–36.0)
MCV: 92.9 fL (ref 78.0–100.0)
Monocytes Absolute: 0.5 10*3/uL (ref 0.1–1.0)
RDW: 14.1 % (ref 11.5–15.5)

## 2013-06-25 LAB — BASIC METABOLIC PANEL
CO2: 23 mEq/L (ref 19–32)
Glucose, Bld: 196 mg/dL — ABNORMAL HIGH (ref 70–99)
Potassium: 4.3 mEq/L (ref 3.5–5.3)
Sodium: 137 mEq/L (ref 135–145)

## 2013-10-22 ENCOUNTER — Ambulatory Visit (INDEPENDENT_AMBULATORY_CARE_PROVIDER_SITE_OTHER): Payer: Medicare Other | Admitting: Internal Medicine

## 2013-10-22 VITALS — BP 147/81 | HR 60 | Temp 97.0°F | Wt 224.1 lb

## 2013-10-22 DIAGNOSIS — I10 Essential (primary) hypertension: Secondary | ICD-10-CM

## 2013-10-22 DIAGNOSIS — I2510 Atherosclerotic heart disease of native coronary artery without angina pectoris: Secondary | ICD-10-CM

## 2013-10-22 DIAGNOSIS — I639 Cerebral infarction, unspecified: Secondary | ICD-10-CM

## 2013-10-22 DIAGNOSIS — E119 Type 2 diabetes mellitus without complications: Secondary | ICD-10-CM

## 2013-10-22 DIAGNOSIS — I6350 Cerebral infarction due to unspecified occlusion or stenosis of unspecified cerebral artery: Secondary | ICD-10-CM

## 2013-10-22 DIAGNOSIS — Z23 Encounter for immunization: Secondary | ICD-10-CM

## 2013-10-22 MED ORDER — FUROSEMIDE 40 MG PO TABS: ORAL_TABLET | ORAL | Status: DC

## 2013-10-22 MED ORDER — ASPIRIN-DIPYRIDAMOLE ER 25-200 MG PO CP12: 1.0000 | ORAL_CAPSULE | Freq: Two times a day (BID) | ORAL | Status: DC

## 2013-10-22 MED ORDER — BENAZEPRIL HCL 20 MG PO TABS: ORAL_TABLET | ORAL | Status: DC

## 2013-10-22 MED ORDER — AMLODIPINE BESYLATE 10 MG PO TABS: 10.0000 mg | ORAL_TABLET | Freq: Every day | ORAL | Status: DC

## 2013-10-22 MED ORDER — OMEPRAZOLE 20 MG PO CPDR: 20.0000 mg | DELAYED_RELEASE_CAPSULE | ORAL | Status: DC

## 2013-10-22 MED ORDER — SIMVASTATIN 20 MG PO TABS: 20.0000 mg | ORAL_TABLET | Freq: Every day | ORAL | Status: DC

## 2013-10-22 MED ORDER — TAMSULOSIN HCL 0.4 MG PO CAPS: 0.4000 mg | ORAL_CAPSULE | Freq: Every day | ORAL | Status: DC

## 2013-10-22 MED ORDER — TRAMADOL-ACETAMINOPHEN 37.5-325 MG PO TABS: 1.0000 | ORAL_TABLET | Freq: Four times a day (QID) | ORAL | Status: DC | PRN

## 2013-10-22 MED ORDER — METOPROLOL TARTRATE 12.5 MG HALF TABLET: 12.5000 mg | ORAL_TABLET | Freq: Two times a day (BID) | ORAL | Status: DC

## 2013-10-22 MED ORDER — GLIPIZIDE 10 MG PO TABS: 10.0000 mg | ORAL_TABLET | Freq: Two times a day (BID) | ORAL | Status: DC

## 2013-10-22 NOTE — Progress Notes (Signed)
Patient ID: Jeremy Sanders, male   DOB: 08-Sep-1925, 79 y.o.   MRN: [id number] 39 man with DM, HTN, and CAD, followed here for primary care and by [facility].  He is meticulous about most of his meds (refuses most doses of lasix).  V. S.:. today stable.  Wgt down 4 lbs.  Says his wife does not cook all meals anymore so he eats somewhat less. His depression is apparantly better.  He goes out to shop and occasionally to eat.  Is meticulous about business affairs and has been busy with this, arranging to dispose of estate among 6 nephews in [place]. Walks in house and yard.  Uses a treadmill for a few minutes each day.  Denies DOE or orthopnea.  Never uses NTG but carries it.  No chest pain. Does not visit eye doctor because she advises cataract extraction.  We discussed this. Lungs clear.  Cor with few ectopics as usual.  Massive edema. Labs in July were unchanged from past 2-3 years.  Will check Bmet because several of his meds require some monitoring. Will not change any meds.  RTC in 6 months.

## 2013-10-23 LAB — BASIC METABOLIC PANEL
CO2: 22 mEq/L (ref 19–32)
Calcium: 9 mg/dL (ref 8.4–10.5)
Creat: 2.64 mg/dL — ABNORMAL HIGH (ref 0.50–1.35)
Glucose, Bld: 144 mg/dL — ABNORMAL HIGH (ref 70–99)

## 2014-02-04 ENCOUNTER — Telehealth: Payer: Self-pay | Admitting: *Deleted

## 2014-02-04 NOTE — Telephone Encounter (Signed)
Call from Dr Aundria Rudogers asking us to schedule pt for an appointment this week. Pt called EMS this morning because of feeling weak.  EMS came to the house and checked vital signs and told pt he did not need to come in to hospital. Pt scheduled for OV tomorrow at 1000

## 2014-02-05 ENCOUNTER — Encounter: Payer: Self-pay | Admitting: Internal Medicine

## 2014-02-05 ENCOUNTER — Ambulatory Visit (INDEPENDENT_AMBULATORY_CARE_PROVIDER_SITE_OTHER): Payer: Medicare Other | Admitting: Internal Medicine

## 2014-02-05 ENCOUNTER — Ambulatory Visit (HOSPITAL_COMMUNITY)
Admission: RE | Admit: 2014-02-05 | Discharge: 2014-02-05 | Disposition: A | Discharge status: Home / Self Care | Payer: Medicare Other | Source: Ambulatory Visit | Attending: Internal Medicine | Admitting: Internal Medicine

## 2014-02-05 VITALS — BP 140/80 | HR 70 | Temp 97.4°F | Ht 69.0 in

## 2014-02-05 DIAGNOSIS — R002 Palpitations: Secondary | ICD-10-CM | POA: Insufficient documentation

## 2014-02-05 DIAGNOSIS — I10 Essential (primary) hypertension: Secondary | ICD-10-CM

## 2014-02-05 DIAGNOSIS — E119 Type 2 diabetes mellitus without complications: Secondary | ICD-10-CM

## 2014-02-05 DIAGNOSIS — I872 Venous insufficiency (chronic) (peripheral): Secondary | ICD-10-CM

## 2014-02-05 DIAGNOSIS — E0590 Thyrotoxicosis, unspecified without thyrotoxic crisis or storm: Secondary | ICD-10-CM

## 2014-02-05 DIAGNOSIS — N259 Disorder resulting from impaired renal tubular function, unspecified: Secondary | ICD-10-CM

## 2014-02-05 DIAGNOSIS — E785 Hyperlipidemia, unspecified: Secondary | ICD-10-CM

## 2014-02-05 DIAGNOSIS — R32 Unspecified urinary incontinence: Secondary | ICD-10-CM

## 2014-02-05 LAB — COMPREHENSIVE METABOLIC PANEL
ALT: 9 U/L (ref 0–53)
AST: 16 U/L (ref 0–37)
Albumin: 3.9 g/dL (ref 3.5–5.2)
Alkaline Phosphatase: 70 U/L (ref 39–117)
BILIRUBIN TOTAL: 0.4 mg/dL (ref 0.2–1.2)
BUN: 30 mg/dL — ABNORMAL HIGH (ref 6–23)
CO2: 22 mEq/L (ref 19–32)
CREATININE: 2.33 mg/dL — AB (ref 0.50–1.35)
Calcium: 8.8 mg/dL (ref 8.4–10.5)
Chloride: 110 mEq/L (ref 96–112)
GLUCOSE: 133 mg/dL — AB (ref 70–99)
Potassium: 4.4 mEq/L (ref 3.5–5.3)
Sodium: 142 mEq/L (ref 135–145)
Total Protein: 6.4 g/dL (ref 6.0–8.3)

## 2014-02-05 LAB — LIPID PANEL
CHOL/HDL RATIO: 2.2 ratio
Cholesterol: 129 mg/dL (ref 0–200)
HDL: 59 mg/dL (ref >39)
LDL Cholesterol: 50 mg/dL (ref 0–99)
TRIGLYCERIDES: 101 mg/dL (ref <150)
VLDL: 20 mg/dL (ref 0–40)

## 2014-02-05 LAB — CBC
HCT: 40.9 % (ref 39.0–52.0)
Hemoglobin: 13.6 g/dL (ref 13.0–17.0)
MCH: 31.3 pg (ref 26.0–34.0)
MCHC: 33.3 g/dL (ref 30.0–36.0)
MCV: 94.2 fL (ref 78.0–100.0)
PLATELETS: 199 10*3/uL (ref 150–400)
RBC: 4.34 MIL/uL (ref 4.22–5.81)
RDW: 14.5 % (ref 11.5–15.5)
WBC: 6.6 10*3/uL (ref 4.0–10.5)

## 2014-02-05 LAB — GLUCOSE, CAPILLARY: GLUCOSE-CAPILLARY: 158 mg/dL — AB (ref 70–99)

## 2014-02-05 LAB — POCT GLYCOSYLATED HEMOGLOBIN (HGB A1C): Hemoglobin A1C: 6.5

## 2014-02-05 NOTE — Assessment & Plan Note (Signed)
Check TSH 

## 2014-02-05 NOTE — Assessment & Plan Note (Signed)
Has not taken Lasix for ~ 2 weeks

## 2014-02-05 NOTE — Assessment & Plan Note (Signed)
EKG largely unchanged from prior with 1st degree AV Block and PVCs and PAC -f/u with Cardiology

## 2014-02-05 NOTE — Patient Instructions (Signed)
General Instructions:  Your EKG is unchanged from your previous but does show some extra beats. We will have you follow up with your hear doctor. We will check your blood and urine today.  If there is need for medication, we will contact you. If you have worsening symptoms especially chest pain, shortness of breath, or difficulty speaking or weaknes is you arms or legs, please call the ambulance and go to the Emergency Department.   Treatment Goals:  Goals (1 Years of Data) as of 02/05/14         As of Today 10/22/13 06/24/13 03/27/13 02/18/13     Blood Pressure    . Blood Pressure < 140/90  161/84 147/81 134/84 150/88 150/85     Result Component    . HEMOGLOBIN A1C < 7.0  6.5 7.0 6.8      . LDL CALC < 100            Progress Toward Treatment Goals:  Treatment Goal 02/05/2014  Hemoglobin A1C at goal  Blood pressure at goal    Self Care Goals & Plans:  Self Care Goal 10/22/2013  Manage my medications take my medicines as prescribed; bring my medications to every visit; refill my medications on time  Eat healthy foods eat baked foods instead of fried foods; eat foods that are low in salt  Be physically active find an activity I enjoy    Home Blood Glucose Monitoring 02/05/2014  Check my blood sugar no home glucose monitoring     Care Management & Community Referrals:  No flowsheet data found.

## 2014-02-05 NOTE — Assessment & Plan Note (Signed)
No hypoglycemia noted

## 2014-02-05 NOTE — Progress Notes (Signed)
   Subjective:    Patient ID: Jeremy Sanders, male    DOB: 1924-12-13, 80 y.o.   MRN: [id number]  HPI  Jeremy Sanders is significant for hyperthyroidism, diabetes mellitus Type 2, CAD s/p CABG 1997, prior CVA without residual effects, hypertension, CKD with baseline creatinine ~2 and chronic venous insufficiency.  He presents with complaints of feeling heaviness in his head and racing heart when he stands up from a seated position for the past 1 week. States that when this occurs that he feels overall 'weak'. No chest pain, no shortness of breath, no headache, no visual changes, no diaphoresis, nausea or vomiting.   He had an episode yesterday and called the EMS but refuse transport to the hospital.     Review of Systems  Constitutional: Negative for fever and fatigue.  HENT: Negative.   Eyes: Negative for visual disturbance.  Respiratory: Negative for chest tightness and shortness of breath.   Cardiovascular: Positive for palpitations and leg swelling. Negative for chest pain.  Gastrointestinal: Negative.   Endocrine: Positive for polyuria.       Polyuria when he takes Lasix  Genitourinary: Positive for enuresis. Negative for dysuria and hematuria.  Musculoskeletal: Negative.   Skin: Negative.   Neurological: Positive for dizziness and weakness. Negative for syncope, numbness and headaches.  Hematological: Does not bruise/bleed easily.  Psychiatric/Behavioral: Negative for dysphoric mood.       Objective:   Physical Exam  Constitutional: He is oriented to person, place, and time. He appears well-developed and well-nourished. No distress.  Elderly gentleman in wheelchair, no family present  HENT:  Head: Normocephalic and atraumatic.  Eyes: Conjunctivae and EOM are normal. Pupils are equal, round, and reactive to light.  Neck: Normal range of motion. Neck supple. No thyromegaly present.  Cardiovascular: Normal rate, regular rhythm and normal heart sounds.   Pedal pulses slight but  bilateral feet well perfused  Pulmonary/Chest: Effort normal and breath sounds normal. No respiratory distress. He has no wheezes.  Abdominal: Soft. Bowel sounds are normal. There is no tenderness.  Musculoskeletal: He exhibits edema. He exhibits no tenderness.       Right lower leg: He exhibits swelling and edema.       Left lower leg: He exhibits swelling and edema.  Significant bilateral lymphedema 2+ pitting, he is wearing a flip flop on his left foot because it no longer fits into his tie-up shoes.  Lymphadenopathy:    He has no cervical adenopathy.  Neurological: He is alert and oriented to person, place, and time. No cranial nerve deficit.  Skin: Skin is warm. There is erythema.     Hyperpigmentation secondary to venous stasis, no weeping or open wounds  Psychiatric: He has a normal mood and affect.          Assessment & Plan:  See separate problem list charting:  Orthostatics--> normal CBG --> no hypoglycemia EKG--> SR 1AVB w/ PVC TSH CBC--> check Hgb CMET --> albumin for nutritional status

## 2014-02-05 NOTE — Assessment & Plan Note (Signed)
Odor of urine on exam. Not taking Lasix currently. -check U/A

## 2014-02-05 NOTE — Progress Notes (Signed)
Case discussed with Dr. Schooler at the time of the visit.  We reviewed the resident's history and exam and pertinent patient test results.  I agree with the assessment, diagnosis, and plan of care documented in the resident's note.     

## 2014-02-05 NOTE — Assessment & Plan Note (Signed)
Not orthostatic on exam today. 

## 2014-02-06 ENCOUNTER — Other Ambulatory Visit: Payer: Self-pay | Admitting: Internal Medicine

## 2014-02-06 DIAGNOSIS — N390 Urinary tract infection, site not specified: Secondary | ICD-10-CM | POA: Insufficient documentation

## 2014-02-06 LAB — URINALYSIS, MICROSCOPIC ONLY
CRYSTALS: NONE SEEN
Casts: NONE SEEN
SQUAMOUS EPITHELIAL / LPF: NONE SEEN

## 2014-02-06 LAB — URINALYSIS, ROUTINE W REFLEX MICROSCOPIC
Bilirubin Urine: NEGATIVE
Glucose, UA: 100 mg/dL — AB
Hgb urine dipstick: NEGATIVE
Ketones, ur: NEGATIVE mg/dL
Leukocytes, UA: NEGATIVE
Nitrite: NEGATIVE
Protein, ur: >300 mg/dL — AB
Specific Gravity, Urine: 1.018 (ref 1.005–1.030)
Urobilinogen, UA: 0.2 mg/dL (ref 0.0–1.0)
pH: 6 (ref 5.0–8.0)

## 2014-02-06 MED ORDER — CEPHALEXIN 250 MG PO CAPS: 250.0000 mg | ORAL_CAPSULE | Freq: Four times a day (QID) | ORAL | Status: DC

## 2014-02-20 ENCOUNTER — Ambulatory Visit: Payer: Medicare Other | Admitting: Physician Assistant

## 2014-03-03 ENCOUNTER — Ambulatory Visit (INDEPENDENT_AMBULATORY_CARE_PROVIDER_SITE_OTHER): Payer: Medicare Other | Admitting: Physician Assistant

## 2014-03-03 ENCOUNTER — Encounter: Payer: Self-pay | Admitting: Physician Assistant

## 2014-03-03 VITALS — BP 178/86 | HR 76 | Ht 69.0 in | Wt 218.8 lb

## 2014-03-03 DIAGNOSIS — N189 Chronic kidney disease, unspecified: Secondary | ICD-10-CM

## 2014-03-03 DIAGNOSIS — I10 Essential (primary) hypertension: Secondary | ICD-10-CM

## 2014-03-03 DIAGNOSIS — R002 Palpitations: Secondary | ICD-10-CM

## 2014-03-03 DIAGNOSIS — I2510 Atherosclerotic heart disease of native coronary artery without angina pectoris: Secondary | ICD-10-CM

## 2014-03-03 DIAGNOSIS — E785 Hyperlipidemia, unspecified: Secondary | ICD-10-CM

## 2014-03-03 NOTE — Progress Notes (Signed)
[address] [place], [place]  [address] Phone: [phone] Fax:  [phone]  Date:  03/03/2014   ID:  Jeremy Sanders, DOB 11-19-1925, MRN [id number]  PCP:  Ky Barban, MD  Cardiologist:  Dr. Verne Carrow     History of Present Illness: Jeremy Sanders is a 80 y.o. male with a hx of CAD s/p CABG in 1997, HTN, HL, diabetes, CKD, prior CVA, hyperthyroidism s/p RAI treatment, venous insufficiency.  LHC in 2004 demonstrated patent grafts and severe dist LAD disease beyond graft insertion.  Med Rx was continued.  Last seen by Dr. Verne Carrow in 02/2013.    He was seen recently at primary care with palpitations.  He returns for follow up.  The patient tells me that he is feeling fine. He has no complaints. He was drinking a lot of coffee and eating peppers. His palpitations stopped when he stopped these items. He denies chest pain, significant dyspnea he denies orthopnea or PND. He denies syncope or near-syncope. He has chronic LE edema. He sometimes does not take Lasix due to frequent urination.   Studies:  - LHC (10/03/03):  S-OM/PL branch (also fills RI branch which was not grafted), L-LAD ok with dist LAD 90 and 90.  Med Rx.  - Echo (01/02/12):  Mild LVH, EF 50%  - Carotid US (01/02/12):  No ICA stenosis.   Recent Labs: 02/05/2014: ALT 9; Creatinine 2.33*; HDL Cholesterol 59; Hemoglobin 13.6; LDL (calc) 50; Potassium 4.4   Wt Readings from Last 3 Encounters:  10/22/13 224 lb 1.6 oz (101.651 kg)  06/24/13 228 lb 4.8 oz (103.556 kg)  03/27/13 224 lb 12.8 oz (101.969 kg)     Past Medical History  Diagnosis Date  . Diabetes mellitus   . Hypertension   . Hyperlipidemia   . Cholelithiasis     Dr. Matthias Hughs  . CAD (coronary artery disease)     s/p CABG in 1997. Dr. Juanda Chance.  . Venous insufficiency of leg   . BPH (benign prostatic hyperplasia)     Dr. Isabel Caprice  . Eosinophilia 2005    Since 2005  . Diverticulosis 2005    GI bleed 02/2004  . Insomnia     . Chronic kidney disease     Cr around 2 since 2005  . Hyperthyroidism     persistently low TSH. RAI treatement in 2002. Reffered to Dr. Ardyth Harps in 2007.  Marland Kitchen Proteinuria 2007    Current Outpatient Prescriptions  Medication Sig Dispense Refill  . acetaminophen (TYLENOL) 325 MG tablet Take 650 mg by mouth as needed for pain.      Marland Kitchen amLODipine (NORVASC) 10 MG tablet Take 1 tablet (10 mg total) by mouth daily.  100 tablet  3  . benazepril (LOTENSIN) 20 MG tablet TAKE 1 TABLET BY MOUTH ONCE DAILY  100 tablet  3  . cephALEXin (KEFLEX) 250 MG capsule Take 1 capsule (250 mg total) by mouth 4 (four) times daily.  28 capsule  0  . cholecalciferol (VITAMIN D) 400 UNITS TABS Take 1,000 Units by mouth daily.      Marland Kitchen dipyridamole-aspirin (AGGRENOX) 200-25 MG per 12 hr capsule Take 1 capsule by mouth 2 (two) times daily.  200 capsule  3  . furosemide (LASIX) 40 MG tablet Take 2 tabs every day  200 tablet  3  . glipiZIDE (GLUCOTROL) 10 MG tablet Take 1 tablet (10 mg total) by mouth 2 (two) times daily before a meal.  200 tablet  3  .  metoprolol tartrate (LOPRESSOR) 12.5 mg TABS tablet Take 0.5 tablets (12.5 mg total) by mouth 2 (two) times daily.  100 tablet  3  . nitroGLYCERIN (NITROSTAT) 0.4 MG SL tablet Place 0.4 mg under the tongue every 5 (five) minutes as needed. For chest pain      . omeprazole (PRILOSEC) 20 MG capsule Take 1 capsule (20 mg total) by mouth every other day.  100 capsule  3  . simvastatin (ZOCOR) 20 MG tablet Take 1 tablet (20 mg total) by mouth at bedtime. .  100 tablet  3  . tamsulosin (FLOMAX) 0.4 MG CAPS capsule Take 1 capsule (0.4 mg total) by mouth daily.  100 capsule  3  . traMADol-acetaminophen (ULTRACET) 37.5-325 MG per tablet Take 1 tablet by mouth every 6 (six) hours as needed. For pain  30 tablet  2   No current facility-administered medications for this visit.    Allergies:   Review of patient's allergies indicates no known allergies.   Social History:  The patient   reports that he has quit smoking. He does not have any smokeless tobacco history on file.   Family History:  The patient's family history is not on file.   ROS:  Please see the history of present illness.      All other systems reviewed and negative.   PHYSICAL EXAM: VS:  There were no vitals taken for this visit. Well nourished, well developed, in no acute distress HEENT: normal Neck:  I cannot assess JVD Cardiac:  normal S1, S2; RRR; no murmur Lungs:  clear to auscultation bilaterally, no wheezing, rhonchi or rales Abd: soft, nontender, no hepatomegaly Ext: massive 3 + bilateral LE edema Skin: warm and dry Neuro:  CNs 2-12 intact, no focal abnormalities noted  EKG:  NSR, HR 76, frequent PACs, rare PVC     ASSESSMENT AND PLAN:  1. Palpitations: These resolved after he stopped drinking coffee. He has not had a recurrence. No further workup at this time. 2. CAD s/p CABG:  No angina. Continue aspirin, statin. 3. Hypertension:  Borderline control. Continue to monitor for now. 4. Hyperlipidemia:  Continue statin. 5. Chronic Kidney Disease:  Recent creatinine stable. 6. Disposition:  Follow up with Dr. Verne Carrowhristopher McAlhany in 3 mos.  Signed, Tereso NewcomerScott Jakori Burkett, PA-C  03/03/2014 10:26 AM

## 2014-03-03 NOTE — Patient Instructions (Signed)
Your physician recommends that you schedule a follow-up appointment in: 3 MONTHS WITH DR. Clifton JamesMCALHANY ; WE WILL SEND YOU A REMINDER LETTER TO MAKE AN APPT  NO CHANGES WERE MADE TODAY

## 2014-06-16 ENCOUNTER — Encounter: Payer: Self-pay | Admitting: Internal Medicine

## 2014-06-16 ENCOUNTER — Ambulatory Visit (INDEPENDENT_AMBULATORY_CARE_PROVIDER_SITE_OTHER): Payer: Medicare Other | Admitting: Internal Medicine

## 2014-06-16 VITALS — BP 102/61 | HR 74 | Temp 97.0°F | Wt 211.4 lb

## 2014-06-16 DIAGNOSIS — I10 Essential (primary) hypertension: Secondary | ICD-10-CM

## 2014-06-16 DIAGNOSIS — H6123 Impacted cerumen, bilateral: Secondary | ICD-10-CM

## 2014-06-16 DIAGNOSIS — K219 Gastro-esophageal reflux disease without esophagitis: Secondary | ICD-10-CM

## 2014-06-16 DIAGNOSIS — E119 Type 2 diabetes mellitus without complications: Secondary | ICD-10-CM

## 2014-06-16 DIAGNOSIS — I2510 Atherosclerotic heart disease of native coronary artery without angina pectoris: Secondary | ICD-10-CM

## 2014-06-16 DIAGNOSIS — I872 Venous insufficiency (chronic) (peripheral): Secondary | ICD-10-CM

## 2014-06-16 DIAGNOSIS — N400 Enlarged prostate without lower urinary tract symptoms: Secondary | ICD-10-CM

## 2014-06-16 DIAGNOSIS — H6120 Impacted cerumen, unspecified ear: Secondary | ICD-10-CM

## 2014-06-16 LAB — GLUCOSE, CAPILLARY: GLUCOSE-CAPILLARY: 123 mg/dL — AB (ref 70–99)

## 2014-06-16 LAB — POCT GLYCOSYLATED HEMOGLOBIN (HGB A1C): Hemoglobin A1C: 6.6

## 2014-06-16 MED ORDER — FUROSEMIDE 40 MG PO TABS: ORAL_TABLET | ORAL | Status: DC

## 2014-06-16 MED ORDER — ASPIRIN-DIPYRIDAMOLE ER 25-200 MG PO CP12: 1.0000 | ORAL_CAPSULE | Freq: Two times a day (BID) | ORAL | Status: DC

## 2014-06-16 MED ORDER — AMLODIPINE BESYLATE 10 MG PO TABS: 10.0000 mg | ORAL_TABLET | Freq: Every day | ORAL | Status: DC

## 2014-06-16 MED ORDER — GLIPIZIDE 10 MG PO TABS: 10.0000 mg | ORAL_TABLET | Freq: Two times a day (BID) | ORAL | Status: DC

## 2014-06-16 MED ORDER — METOPROLOL TARTRATE 12.5 MG HALF TABLET: 12.5000 mg | ORAL_TABLET | Freq: Two times a day (BID) | ORAL | Status: DC

## 2014-06-16 MED ORDER — BENAZEPRIL HCL 20 MG PO TABS: ORAL_TABLET | ORAL | Status: DC

## 2014-06-16 MED ORDER — TAMSULOSIN HCL 0.4 MG PO CAPS: 0.4000 mg | ORAL_CAPSULE | Freq: Every day | ORAL | Status: DC

## 2014-06-16 MED ORDER — SIMVASTATIN 20 MG PO TABS: 20.0000 mg | ORAL_TABLET | Freq: Every day | ORAL | Status: DC

## 2014-06-16 MED ORDER — OMEPRAZOLE 20 MG PO CPDR: 20.0000 mg | DELAYED_RELEASE_CAPSULE | ORAL | Status: DC

## 2014-06-16 MED ORDER — BENAZEPRIL HCL 20 MG PO TABS
ORAL_TABLET | ORAL | Status: DC
Start: 2014-06-16 — End: 2015-06-12

## 2014-06-16 MED ORDER — OMEPRAZOLE 20 MG PO CPDR
20.0000 mg | DELAYED_RELEASE_CAPSULE | ORAL | Status: DC
Start: 2014-06-16 — End: 2015-07-17

## 2014-06-16 MED ORDER — MEDICAL COMPRESSION SOCKS MISC: Status: AC

## 2014-06-16 NOTE — Patient Instructions (Addendum)
General Instructions: -Your blood pressure is well controlled today.  -Start using the compression socks  -Call us if you start feeling dizzy frequently.  -Continue eating healthy meals every day. Try to avoid skipping meals.  -Follow up in 3-4 months or sooner as needed.  -Foi um plazer conhece-lo!  It was a pleasure meeting you!   Treatment Goals:  Goals (1 Years of Data) as of 06/16/14         As of Today 03/03/14 02/05/14 02/05/14 02/05/14     Blood Pressure    . Blood Pressure < 140/90  102/61 178/86 140/80 140/80 161/84     Result Component    . HEMOGLOBIN A1C < 7.0  6.6  6.5      . LDL CALC < 100    50        Progress Toward Treatment Goals:  Treatment Goal 06/16/2014  Hemoglobin A1C at goal  Blood pressure at goal    Self Care Goals & Plans:  Self Care Goal 06/16/2014  Manage my medications -  Eat healthy foods -  Be physically active -  Meeting treatment goals maintain the current self-care plan    Home Blood Glucose Monitoring 06/16/2014  Check my blood sugar no home glucose monitoring     Care Management & Community Referrals:  Referral 06/16/2014  Referrals made for care management support none needed      Venous Stasis or Chronic Venous Insufficiency Chronic venous insufficiency, also called venous stasis, is a condition that affects the veins in the legs. The condition prevents blood from being pumped through these veins effectively. Blood may no longer be pumped effectively from the legs back to the heart. This condition can range from mild to severe. With proper treatment, you should be able to continue with an active life. CAUSES  Chronic venous insufficiency occurs when the vein walls become stretched, weakened, or damaged or when valves within the vein are damaged. Some common causes of this include:  High blood pressure inside the veins (venous hypertension).  Increased blood pressure in the leg veins from long periods of sitting or standing.  A  blood clot that blocks blood flow in a vein (deep vein thrombosis).  Inflammation of a superficial vein (phlebitis) that causes a blood clot to form. RISK FACTORS Various things can make you more likely to develop chronic venous insufficiency, including:  Family history of this condition.  Obesity.  Pregnancy.  Sedentary lifestyle.  Smoking.  Jobs requiring long periods of standing or sitting in one place.  Being a certain age. Women in their 3540s and 3650s and men in their 4370s are more likely to develop this condition. SIGNS AND SYMPTOMS  Symptoms may include:   Varicose veins.  Skin breakdown or ulcers.  Reddened or discolored skin on the leg.  Brown, smooth, tight, and painful skin just above the ankle, usually on the inside surface (lipodermatosclerosis).  Swelling. DIAGNOSIS  To diagnose this condition, your health care provider will take a medical history and do a physical exam. The following tests may be ordered to confirm the diagnosis:  Duplex ultrasound--A procedure that produces a picture of a blood vessel and nearby organs and also provides information on blood flow through the blood vessel.  Plethysmography--A procedure that tests blood flow.  A venogram, or venography--A procedure used to look at the veins using X-ray and dye. TREATMENT The goals of treatment are to help you return to an active life and to minimize pain or  disability. Treatment will depend on the severity of the condition. Medical procedures may be needed for severe cases. Treatment options may include:   Use of compression stockings. These can help with symptoms and lower the chances of the problem getting worse, but they do not cure the problem.  Sclerotherapy--A procedure involving an injection of a material that "dissolves" the damaged veins. Other veins in the network of blood vessels take over the function of the damaged veins.  Surgery to remove the vein or cut off blood flow through  the vein (vein stripping or laser ablation surgery).  Surgery to repair a valve. HOME CARE INSTRUCTIONS   Wear compression stockings as directed by your health care provider.  Only take over-the-counter or prescription medicines for pain, discomfort, or fever as directed by your health care provider.  Follow up with your health care provider as directed. SEEK MEDICAL CARE IF:   You have redness, swelling, or increasing pain in the affected area.  You see a red streak or line that extends up or down from the affected area.  You have a breakdown or loss of skin in the affected area, even if the breakdown is small.  You have an injury to the affected area. SEEK IMMEDIATE MEDICAL CARE IF:   You have an injury and open wound in the affected area.  Your pain is severe and does not improve with medicine.  You have sudden numbness or weakness in the foot or ankle below the affected area, or you have trouble moving your foot or ankle.  You have a fever or persistent symptoms for more than 2-3 days.  You have a fever and your symptoms suddenly get worse. MAKE SURE YOU:   Understand these instructions.  Will watch your condition.  Will get help right away if you are not doing well or get worse. Document Released: 03/20/2007 Document Revised: 09/04/2013 Document Reviewed: 07/22/2013 [facility] Patient Information 2015 [place], [place]. This information is not intended to replace advice given to you by your health care provider. Make sure you discuss any questions you have with your health care provider.

## 2014-06-18 DIAGNOSIS — R809 Proteinuria, unspecified: Secondary | ICD-10-CM | POA: Insufficient documentation

## 2014-06-18 DIAGNOSIS — H6123 Impacted cerumen, bilateral: Secondary | ICD-10-CM | POA: Insufficient documentation

## 2014-06-18 NOTE — Assessment & Plan Note (Signed)
Lab Results  Component Value Date   HGBA1C 6.6 06/16/2014   HGBA1C 6.5 02/05/2014   HGBA1C 7.0 10/22/2013     Assessment: Diabetes control: good control (HgbA1C at goal) Progress toward A1C goal:  at goal Comments: He is on glipizide 10mg  BID ac. He denies hypoglycemic symptoms. He reports that he is eating less than usual because his wife is not cooking "rice and beans" as much which is his favorite dish but he does eat regular meals, especially fried chicken.   Plan: Medications:  continue current medications Home glucose monitoring: Frequency: no home glucose monitoring Timing:   Instruction/counseling given: discussed diet and counseled him to continue eating regular meals Educational resources provided:   Self management tools provided:   Other plans: Follow up in 3-4 months.

## 2014-06-18 NOTE — Assessment & Plan Note (Addendum)
He reports that the swelling in his legs improved with Lasix tx. He has cotton mild "compression" socks that he wears in his left leg and improves the sweeling. He is interested in trying compression socks for his bilateral legs.  -Rx compression socks for bilateral legs

## 2014-06-18 NOTE — Progress Notes (Signed)
   Subjective:    Patient ID: Jeremy Sanders, male    DOB: 16-Jun-1925, 80 y.o.   MRN: [id number]  HPI Mr. Jeremy Sanders is an 80 year-old man with PMH significant for hyperthyroidism, diabetes mellitus Type 2, CAD s/p CABG 1997, prior CVA without residual effects, hypertension, CKD with baseline creatinine ~2 and chronic venous insufficiency who presents for routine follow up for his HTN. In addition he complains of bilateral decreased hearing for the past 3 weeks or so. He also requests refill of his medications.     Review of Systems  Constitutional: Negative for fever, activity change, appetite change, fatigue and unexpected weight change.  Respiratory: Negative for shortness of breath.   Cardiovascular: Positive for leg swelling. Negative for chest pain.  Gastrointestinal: Negative for abdominal pain.  Genitourinary: Negative for dysuria and frequency.  Neurological: Negative for dizziness and light-headedness.  Psychiatric/Behavioral: Negative for agitation.       Objective:   Physical Exam  Nursing note and vitals reviewed. Constitutional: He is oriented to person, place, and time. He appears well-developed and well-nourished. No distress.  Well groomed, sitting in wheelchair.   HENT:  Bilateral ear canal with cerumen impaction. Abnormal ginger-rub test bilaterally.   Eyes: Conjunctivae are normal.  Cardiovascular: Normal rate and regular rhythm.   Pulmonary/Chest: Effort normal. No respiratory distress. He has no wheezes. He has no rales.  Abdominal: Soft. Bowel sounds are normal. There is no tenderness.  Musculoskeletal: He exhibits edema. He exhibits no tenderness.  LE with 3+ pitting edema bilaterally up to just below his knees.   Neurological: He is alert and oriented to person, place, and time.  Sensation in bilateral feet is intact.   Skin: Skin is warm and dry. He is not diaphoretic. There is erythema.  LE with bilateral skin thickening and  Venous insufficiency dermatitis  with no increased warmth, no TTP, no skin break down, no ulcers.   Psychiatric: He has a normal mood and affect.          Assessment & Plan:

## 2014-06-18 NOTE — Progress Notes (Signed)
Case discussed with Dr. Kennerly soon after the resident saw the patient.  We reviewed the resident's history and exam and pertinent patient test results.  I agree with the assessment, diagnosis, and plan of care documented in the resident's note. 

## 2014-06-18 NOTE — Assessment & Plan Note (Signed)
BP Readings from Last 3 Encounters:  06/16/14 102/61  03/03/14 178/86  02/05/14 140/80    Lab Results  Component Value Date   NA 142 02/05/2014   K 4.4 02/05/2014   CREATININE 2.33* 02/05/2014    Assessment: Blood pressure control: controlled Progress toward BP goal:  at goal Comments: He is on lopressor 12.5 BID, Lasix 40mg  BID, Lotensin 20mg  daily, and Norvasc 10mg  daily  Plan: Medications:  continue current medications Educational resources provided:   Self management tools provided:   Other plans: Follow up in 3-4 months.

## 2014-06-18 NOTE — Assessment & Plan Note (Signed)
He underwent bilateral ear irrigation with improvement of his hearing bilaterally.

## 2014-06-18 NOTE — Assessment & Plan Note (Signed)
Pt states that he has seen Nephrology in the past but with no clear need to see them again at this time.  Continue Lasix for LE edema.

## 2014-09-02 ENCOUNTER — Other Ambulatory Visit: Payer: Self-pay | Admitting: Internal Medicine

## 2014-09-02 DIAGNOSIS — I10 Essential (primary) hypertension: Secondary | ICD-10-CM

## 2014-10-13 ENCOUNTER — Ambulatory Visit (INDEPENDENT_AMBULATORY_CARE_PROVIDER_SITE_OTHER): Payer: Medicare Other | Admitting: Internal Medicine

## 2014-10-13 ENCOUNTER — Encounter: Payer: Self-pay | Admitting: Internal Medicine

## 2014-10-13 VITALS — BP 145/88 | HR 69 | Temp 97.0°F | Wt 206.6 lb

## 2014-10-13 DIAGNOSIS — I872 Venous insufficiency (chronic) (peripheral): Secondary | ICD-10-CM

## 2014-10-13 DIAGNOSIS — Z23 Encounter for immunization: Secondary | ICD-10-CM

## 2014-10-13 DIAGNOSIS — R269 Unspecified abnormalities of gait and mobility: Secondary | ICD-10-CM

## 2014-10-13 DIAGNOSIS — I10 Essential (primary) hypertension: Secondary | ICD-10-CM

## 2014-10-13 DIAGNOSIS — E119 Type 2 diabetes mellitus without complications: Secondary | ICD-10-CM

## 2014-10-13 LAB — GLUCOSE, CAPILLARY: Glucose-Capillary: 86 mg/dL (ref 70–99)

## 2014-10-13 LAB — POCT GLYCOSYLATED HEMOGLOBIN (HGB A1C): HEMOGLOBIN A1C: 6.4

## 2014-10-13 MED ORDER — METOPROLOL TARTRATE 25 MG PO TABS: 12.5000 mg | ORAL_TABLET | Freq: Two times a day (BID) | ORAL | Status: DC

## 2014-10-13 MED ORDER — AMLODIPINE BESYLATE 5 MG PO TABS: 5.0000 mg | ORAL_TABLET | Freq: Every day | ORAL | Status: DC

## 2014-10-13 NOTE — Progress Notes (Signed)
Patient ID: Jeremy Sanders, male   DOB: 01/03/1925, 80 y.o.   MRN: [id number] Case discussed with Dr. Garald BraverKennerly soon after the resident saw the patient.  We reviewed the resident's history and exam and pertinent patient test results.  I agree with the assessment, diagnosis, and plan of care documented in the resident's note.

## 2014-10-13 NOTE — Progress Notes (Signed)
   Subjective:    Patient ID: Jeremy Sanders, male    DOB: [birth year], 80 y.o.   MRN: [id number]  HPI Mr. Jeremy Sanders is a 80 year old man with PMH significant for hyperthyroidism, diabetes mellitus Type 2, CAD s/p CABG 1997, prior CVA without residual effects, hypertension, CKD with baseline creatinine ~2 and chronic venous insufficiency who presents for routine follow up for his HTN and DM2. He also needs medication refills. He complains of intermittent dizziness/lightheadednes this morning but states that this has been going on for years and usually resolves on its own. He is concerned about his decline in health and his potential loss of driving ability.     Review of Systems  Constitutional: Negative for fever, chills, diaphoresis, activity change, appetite change, fatigue and unexpected weight change.  Respiratory: Negative for cough.   Cardiovascular: Positive for leg swelling. Negative for chest pain.  Gastrointestinal: Negative for abdominal pain.  Genitourinary: Negative for dysuria and difficulty urinating.  Neurological: Positive for dizziness. Negative for syncope and light-headedness.  Psychiatric/Behavioral: Negative for agitation.       Objective:   Physical Exam  Constitutional: He appears well-developed and well-nourished. No distress.  Skin: He is not diaphoretic.  Nursing note and vitals reviewed. Not as well groomed, sitting in wheelchair.  HENT:  Moist mucus membranes Eyes: Conjunctivae are normal.  Cardiovascular: Normal rate and regular rhythm.  Pulmonary/Chest: Effort normal. No respiratory distress. He has no wheezes. He has no rales.  Abdominal: Soft. Bowel sounds are normal. There is no tenderness.  Musculoskeletal: He exhibits edema. He exhibits no tenderness.  LE with 3+ pitting edema bilaterally up to just below his knees.  Neurological: He is alert and oriented to person, place, and time.  Sensation in bilateral feet is intact.  Skin: Skin is warm  and dry. He is not diaphoretic. There is erythema.  LE with bilateral skin thickening and Venous insufficiency dermatitis with no increased warmth, no TTP, no skin break down, no ulcers.  Psychiatric: He has a normal mood and affect.         Assessment & Plan:

## 2014-10-13 NOTE — Assessment & Plan Note (Signed)
He continues to drive but wants to explore other options for transportation.  -[facility] -referred to CSW for other transportation options.  -He has no records of MVC but may need DMV evaluation for safety while driving

## 2014-10-13 NOTE — Assessment & Plan Note (Signed)
Lab Results  Component Value Date   HGBA1C 6.4 10/13/2014   HGBA1C 6.6 06/16/2014   HGBA1C 6.5 02/05/2014     Assessment: Diabetes control: good control (HgbA1C at goal) Progress toward A1C goal:  at goal Comments: diet controlled  Plan: Medications:  continue diet control Home glucose monitoring: Frequency: no home glucose monitoring Timing:   Instruction/counseling given: reminded to get eye exam, discussed foot care and discussed diet Educational resources provided:   Self management tools provided:   Other plans: Follow up in 3months

## 2014-10-13 NOTE — Assessment & Plan Note (Signed)
He will try compression socks. [facility] and they may assist him with this.

## 2014-10-13 NOTE — Assessment & Plan Note (Signed)
BP Readings from Last 3 Encounters:  10/13/14 145/88  06/16/14 102/61  03/03/14 178/86    Lab Results  Component Value Date   NA 142 02/05/2014   K 4.4 02/05/2014   CREATININE 2.33* 02/05/2014    Assessment: Blood pressure control: controlled Progress toward BP goal:  at goal Comments: He is on amlodipine 10mg  daily, benazepril 20mg  daily, Lasix 40mg  daily, lopressor 12.5mg  BID.   Plan: Medications:  continue current medications but cut amlodipine to 5mg  daily given his lightheadedness in the mornings with BP checks at home. Given his age, will allow for BP goal of 160/90.  Educational resources provided:   Self management tools provided:   Other plans: Follow up in 3 months. [facility] ordered.

## 2014-10-13 NOTE — Patient Instructions (Signed)
General Instructions: -Start taking amlodipine 5mg  daily, this is half the tablet of the amlodipine 10mg  daily.  -Start checking your blood pressure at home with a cuff. Your blood pressure should be less than 160/90 but more than 100/70.  -Try using compression stocking to help reduce the swelling in your legs.  -Follow up in 3 months.  -Have a wonderful Holiday Season!  Please bring your medicines with you each time you come to clinic.  Medicines may include prescription medications, over-the-counter medications, herbal remedies, eye drops, vitamins, or other pills.   Progress Toward Treatment Goals:  Treatment Goal 10/13/2014  Hemoglobin A1C at goal  Blood pressure at goal    Self Care Goals & Plans:  Self Care Goal 10/13/2014  Manage my medications take my medicines as prescribed; bring my medications to every visit; refill my medications on time  Eat healthy foods -  Be physically active -  Meeting treatment goals maintain the current self-care plan    Home Blood Glucose Monitoring 10/13/2014  Check my blood sugar no home glucose monitoring     Care Management & Community Referrals:  Referral 10/13/2014  Referrals made for care management support social worker

## 2014-10-20 ENCOUNTER — Other Ambulatory Visit: Payer: Self-pay | Admitting: Internal Medicine

## 2014-10-20 ENCOUNTER — Telehealth: Payer: Self-pay | Admitting: Licensed Clinical Social Worker

## 2014-10-20 ENCOUNTER — Telehealth: Payer: Self-pay | Admitting: *Deleted

## 2014-10-20 DIAGNOSIS — I872 Venous insufficiency (chronic) (peripheral): Secondary | ICD-10-CM

## 2014-10-20 NOTE — Telephone Encounter (Signed)
Mr. Jeremy Sanders was referred to CSW for home health services and information on community transportation.  Pt is currently still driving but wanted information on services when he is no longer able to drive.  CSW inquired if Mr. Jeremy Sanders preferred [place] or Spanish written information, English will be sent per request.  In addition, CS[facility] discussed [facility].  Pt declined stating the home health RN was for his wife, not him.  Letter mailed to Mr. Jeremy Sanders regarding [employer] and [employer] senior transportation. Pt denies additional SW needs at this time.

## 2014-10-20 NOTE — Telephone Encounter (Signed)
I placed a referral to the wound center in Epic.   Thanks!  SK

## 2014-10-20 NOTE — Telephone Encounter (Signed)
[facility] called about compression stocking. Saw pt unable to supply commpression stockings due to size of swelling in feet. She suggest maybe to send pt to the Wound Center for [place] - one foot at a time to get swelling down for compression stocking. Stanton KidneyDebra Kwamaine Cuppett RN 10/20/14 11:25AM

## 2014-11-04 NOTE — Telephone Encounter (Signed)
Pt has appt with Wound Center - pt aware.

## 2014-11-11 ENCOUNTER — Encounter (HOSPITAL_BASED_OUTPATIENT_CLINIC_OR_DEPARTMENT_OTHER): Payer: Medicare Other | Attending: General Surgery

## 2015-01-27 ENCOUNTER — Ambulatory Visit (INDEPENDENT_AMBULATORY_CARE_PROVIDER_SITE_OTHER): Payer: Medicare Other | Admitting: Internal Medicine

## 2015-01-27 ENCOUNTER — Encounter: Payer: Self-pay | Admitting: Internal Medicine

## 2015-01-27 VITALS — BP 168/83 | HR 114 | Temp 98.6°F | Wt 223.3 lb

## 2015-01-27 DIAGNOSIS — E119 Type 2 diabetes mellitus without complications: Secondary | ICD-10-CM

## 2015-01-27 DIAGNOSIS — I872 Venous insufficiency (chronic) (peripheral): Secondary | ICD-10-CM

## 2015-01-27 LAB — GLUCOSE, CAPILLARY: GLUCOSE-CAPILLARY: 140 mg/dL — AB (ref 70–99)

## 2015-01-27 LAB — POCT GLYCOSYLATED HEMOGLOBIN (HGB A1C): HEMOGLOBIN A1C: 6.6

## 2015-01-27 NOTE — Progress Notes (Signed)
   Subjective:    Patient ID: Jeremy Sanders, male    DOB: 25-May-1925, 81 y.o.   MRN: [id number]  HPI Jeremy Sanders is an 81 yr old man with PMH of hyperthyroidism, DM2, CAD s/p CABG, HTN, CKD3, chronic venous insufficiency presenting for follow up visit for worsening lower extremity edema. He has had chronic lower extremity edema from venous insufficiency, he is on Lasix 80 mg daily but he is unable to wrap his legs or to place TED hose on his own due to his decreased mobility.  He reports not being able to run errands as well as he used to and asks for help with getting groceries to his house and picking up medications at the pharmacy.     Review of Systems  Constitutional: Negative for fever, chills, diaphoresis, activity change, appetite change, fatigue and unexpected weight change.  Respiratory: Negative for cough and shortness of breath.   Cardiovascular: Positive for leg swelling. Negative for chest pain.  Gastrointestinal: Negative for abdominal pain.  Genitourinary: Negative for dysuria and frequency.  Musculoskeletal: Negative for back pain.  Skin: Negative for rash.  Neurological: Negative for dizziness and light-headedness.  Psychiatric/Behavioral: Negative for agitation.       Objective:   Physical Exam  Constitutional: He is oriented to person, place, and time. He appears well-developed and well-nourished. No distress.  Sitting in wheelchair   HENT:  Head: Normocephalic.  Cardiovascular: Normal rate.   Pulmonary/Chest: Effort normal. No respiratory distress.  Abdominal: There is no tenderness.  Musculoskeletal: He exhibits edema.  3+ pitting edema of bilateral legs up to his knees, L>R with some areas of weeping in anterior lower left leg and posterior lower right leg. There is a 4cmx2cm superficial wound at the left anterior shin with no surrounding edema/erythema, no purulent discharge.  The skin is appropriately warm to touch with pink coloration. Pedal pulses present  bilaterally.   Neurological: He is alert and oriented to person, place, and time.  Skin: Skin is warm and dry. He is not diaphoretic.  Psychiatric: He has a normal mood and affect.  Nursing note and vitals reviewed.         Assessment & Plan:

## 2015-01-27 NOTE — Patient Instructions (Signed)
General Instructions: -They will call you before the visit to your house.  -Please let us know if you want more help at home.  -Follow up with us in 3 months.    Thank you for bringing your medicines today. This helps us keep you safe from mistakes.   Progress Toward Treatment Goals:  Treatment Goal 10/13/2014  Hemoglobin A1C at goal  Blood pressure at goal    Self Care Goals & Plans:  Self Care Goal 10/13/2014  Manage my medications take my medicines as prescribed; bring my medications to every visit; refill my medications on time  Eat healthy foods -  Be physically active -  Meeting treatment goals maintain the current self-care plan    Home Blood Glucose Monitoring 10/13/2014  Check my blood sugar no home glucose monitoring     Care Management & Community Referrals:  Referral 10/13/2014  Referrals made for care management support social worker

## 2015-01-28 ENCOUNTER — Telehealth: Payer: Self-pay | Admitting: Licensed Clinical Social Worker

## 2015-01-28 ENCOUNTER — Encounter: Payer: Self-pay | Admitting: Licensed Clinical Social Worker

## 2015-01-28 NOTE — Telephone Encounter (Signed)
Mr. Mort SawyersSalvador was referred to CSW as [facility] and is requesting information on transportation and grocery shopping assistance.  CSW placed call to pt.  Mr. Mort SawyersSalvador in agreement with [facility], referral completed.  CSW discussed current needs for assistance.  Pt states he currently does not need assistance but may need it one day.  CSW informed Mr. Mort SawyersSalvador will send information about [employer] and information on online grocery shopping and pick up/[employer] call in orders.  Letter mailed.  Mr. Mort SawyersSalvador denies add'l social work needs at this time.

## 2015-01-28 NOTE — Assessment & Plan Note (Signed)
Lab Results  Component Value Date   HGBA1C 6.6 01/27/2015   HGBA1C 6.4 10/13/2014   HGBA1C 6.6 06/16/2014     Assessment: Diabetes control:  controlled Progress toward A1C goal:   at goal Comments: He is on glipizide 10mg  BID ac. He could come off this medication in the future with Hgb A1C goal of 8%, however, given the state of leg edema with some wounds, he agrees to stay on this medication for now until his leg edema improves.   Plan: Medications:  continue current medications Home glucose monitoring: Frequency:   Timing:   Instruction/counseling given: discussed diet Educational resources provided:   Self management tools provided:   Other plans: Follow up in 3 weeks (for leg eval) then 3 months.

## 2015-01-28 NOTE — Assessment & Plan Note (Signed)
He is unable to put TED hoses on. He was referred to the wound care clinic recently but they could not see him as he did not have a wound at that time. [facility] but he had refused this service as he "did not want strangers" in his house. He is now aggregate to having a HH RN come in to his house for wound care and possible bilateral leg wrapping to help improve with his bilateral LE edema. He continues to drive with no difficulty but is interested in having help with errands such as getting groceries/meals delivered to the house and pharmacy pick up--he had pharmacy delivery services in the past but this was not working well.  -Ordered HH RN for wound care, face-to-face entered.  -Referral to Lynnae JanuaryShana Grady for additional services for meal delivery (meals on wheels), grocery delivery, possible pharmacy delivery. Pt states that he must "resist" and aid or any other HH help at this time, he states that his house is very organized and he does not feel like he needs help with [facility], cleaning/meal prep at home.

## 2015-01-29 ENCOUNTER — Encounter: Payer: Self-pay | Admitting: Licensed Clinical Social Worker

## 2015-01-30 ENCOUNTER — Telehealth: Payer: Self-pay | Admitting: Internal Medicine

## 2015-01-30 DIAGNOSIS — I872 Venous insufficiency (chronic) (peripheral): Secondary | ICD-10-CM

## 2015-01-30 NOTE — Progress Notes (Signed)
Internal Medicine Clinic Attending  Case discussed with Dr. Kennerly soon after the resident saw the patient.  We reviewed the resident's history and exam and pertinent patient test results.  I agree with the assessment, diagnosis, and plan of care documented in the resident's note.  

## 2015-01-30 NOTE — Telephone Encounter (Signed)
[facility] She recommends una boots twice weekly for now and [facility] Verbal order given, placed HH SW order in.

## 2015-02-12 ENCOUNTER — Telehealth: Payer: Self-pay | Admitting: Internal Medicine

## 2015-02-12 NOTE — Telephone Encounter (Signed)
Call to patient to confirm appointment for 02/16/15 at 2:15 no answer

## 2015-02-16 ENCOUNTER — Ambulatory Visit (INDEPENDENT_AMBULATORY_CARE_PROVIDER_SITE_OTHER): Payer: Medicare Other | Admitting: Internal Medicine

## 2015-02-16 ENCOUNTER — Encounter: Payer: Self-pay | Admitting: Internal Medicine

## 2015-02-16 VITALS — BP 151/75 | HR 60 | Temp 98.6°F | Wt 205.0 lb

## 2015-02-16 DIAGNOSIS — Z23 Encounter for immunization: Secondary | ICD-10-CM

## 2015-02-16 DIAGNOSIS — I10 Essential (primary) hypertension: Secondary | ICD-10-CM

## 2015-02-16 DIAGNOSIS — E1122 Type 2 diabetes mellitus with diabetic chronic kidney disease: Secondary | ICD-10-CM | POA: Diagnosis not present

## 2015-02-16 DIAGNOSIS — N184 Chronic kidney disease, stage 4 (severe): Secondary | ICD-10-CM

## 2015-02-16 DIAGNOSIS — E119 Type 2 diabetes mellitus without complications: Secondary | ICD-10-CM

## 2015-02-16 DIAGNOSIS — I872 Venous insufficiency (chronic) (peripheral): Secondary | ICD-10-CM | POA: Diagnosis not present

## 2015-02-16 DIAGNOSIS — Z0000 Encounter for general adult medical examination without abnormal findings: Secondary | ICD-10-CM | POA: Insufficient documentation

## 2015-02-16 NOTE — Assessment & Plan Note (Addendum)
Left leg has significantly decreased in diameter since [facility] boot treatment [facility].  Right leg still has edema but with no open wounds appreciated on exam today.  -Continue HH RN twice weekly for continued tx of both legs and light compression on right leg (will honor his request to avoid una boot to the right leg while he still drives), may benefit from TED hose for both legs once the swelling has further decreased.

## 2015-02-16 NOTE — Assessment & Plan Note (Addendum)
Received prevnar 13 during this visit.  No melena/hematochezia. Colonoscopy not indicated for his age group.   Advance Directives forms given to him during this visit. He wants to read it and may drop it off at Dr. Darrall DearsMagrinat's office.

## 2015-02-16 NOTE — Assessment & Plan Note (Addendum)
Lab Results  Component Value Date   HGBA1C 6.6 01/27/2015   HGBA1C 6.4 10/13/2014   HGBA1C 6.6 06/16/2014     Assessment: Diabetes control: fair control Progress toward A1C goal:  Controlled Comments: no hypoglycemia. Will continue glipizide until leg swelling improved (high risk of wound infxn if legs start weeping/develop ulcers)  Plan: Medications:  continue current medications Home glucose monitoring: Frequency:   Timing:   Instruction/counseling given: reminded to get eye exam and discussed diet Educational resources provided:   Self management tools provided:   Other plans: Pt declines referral for eye exam at this time since his wife will start chemo and he may not have time to keep this appointment. Lipid panel and DM foot exam deferred to next visit (leg is wrapped).

## 2015-02-16 NOTE — Progress Notes (Signed)
   Subjective:    Patient ID: Jeremy Sanders, male    DOB: 09/04/1925, 81 y.o.   MRN: [id number]  HPI Jeremy Sanders is an 81 yr old man with PMH of hyperthyroidism, CAD s/p CABG, CKD3, DM2, chronic venous insufficiency, presenting for follow up visit for bilateral LE edema. He has had chronic lower extremity edema, was seen by me on 3/1 [facility]. Since then he has had una boot treatment of his left leg and has noticeable decrease in swelling. He has declined Print production planneruna boot treatment for the right left due to concerns of this interfering with his ability to drive and continues to have edema in this leg, still unable to wear a TED hose.    Review of Systems  Constitutional: Negative for fever, chills, diaphoresis, activity change, appetite change and fatigue.  Respiratory: Negative for cough and shortness of breath.   Cardiovascular: Positive for leg swelling. Negative for chest pain and palpitations.  Gastrointestinal: Negative for abdominal pain.  Genitourinary: Negative for dysuria.  Neurological: Negative for dizziness and light-headedness.  Psychiatric/Behavioral: Negative for agitation.       Objective:   Physical Exam  Constitutional: He is oriented to person, place, and time. He appears well-developed and well-nourished. No distress.  Sitting in wheelchair   Cardiovascular: Normal rate.   Pulmonary/Chest: Effort normal. No respiratory distress.  Abdominal: Soft.  Musculoskeletal: He exhibits edema.  Left leg with una boot, considerably smaller diameter compared to his least visit. Intact distal pulse and digit with foot appropriately warm to touch with no TTP.  Right leg with 3+ pitting edema, chronic venous stasis skin changes with no open wounds.    Neurological: He is alert and oriented to person, place, and time. Coordination abnormal.  Skin: Skin is warm and dry. He is not diaphoretic.  Psychiatric: He has a normal mood and affect.  Nursing note and vitals  reviewed.         Assessment & Plan:

## 2015-02-16 NOTE — Patient Instructions (Signed)
General Instructions: -Continue cutting back on chocolates and candy.  -Please ask the nurse visiting your home to wrap your right leg with light compression.  -Start using compression socks on the left leg.  -Please fill up the Advance Directives form. You may give this to Dr. Darnelle CatalanMagrinat.  -Follow up with us in 3 months or sooner as needed.    Please bring your medicines with you each time you come to clinic.  Medicines may include prescription medications, over-the-counter medications, herbal remedies, eye drops, vitamins, or other pills.   Progress Toward Treatment Goals:  Treatment Goal 02/16/2015  Hemoglobin A1C deteriorated  Blood pressure unchanged    Self Care Goals & Plans:  Self Care Goal 02/16/2015  Manage my medications refill my medications on time  Eat healthy foods -  Be physically active -  Meeting treatment goals maintain the current self-care plan    Home Blood Glucose Monitoring 10/13/2014  Check my blood sugar no home glucose monitoring     Care Management & Community Referrals:  Referral 10/13/2014  Referrals made for care management support social worker

## 2015-02-16 NOTE — Assessment & Plan Note (Addendum)
BP Readings from Last 3 Encounters:  02/16/15 151/75  01/27/15 168/83  10/13/14 145/88    Lab Results  Component Value Date   NA 142 02/05/2014   K 4.4 02/05/2014   CREATININE 2.33* 02/05/2014    Assessment: Blood pressure control: mildly elevated Progress toward BP goal:  unchanged Comments: He is on Norvasc 5mg  daily, lotensin 20mg  daily, metoprolol 12.5mg  BID, and Lasix 80mg  daily. Assures compliance with his medications. BP only mildly elevated with goal SBP goal <150 given his age  Plan: Medications:  continue current medications Educational resources provided:   Self management tools provided:   Other plans: Follow up in 6 months.

## 2015-02-17 ENCOUNTER — Encounter: Payer: Self-pay | Admitting: Internal Medicine

## 2015-02-17 NOTE — Progress Notes (Signed)
Case discussed with Dr. Kennerly soon after the resident saw the patient.  We reviewed the resident's history and exam and pertinent patient test results.  I agree with the assessment, diagnosis, and plan of care documented in the resident's note. 

## 2015-02-23 ENCOUNTER — Telehealth: Payer: Self-pay | Admitting: *Deleted

## 2015-02-23 NOTE — Telephone Encounter (Signed)
I called Sharman CheekLakashi and explained that the patient could have una boot if he preferred this and if it would not interfere with his driving. If he did not want it they may proceed with ACE wrap. Sharman CheekLakashi will see him tomorrow afternoon and will discuss this plan.  Thanks! SK

## 2015-02-23 NOTE — Telephone Encounter (Signed)
Call from [place], [facility] - # [phone]  Nurse called to clarify orders for leg compression. They need to know exactly what you want. On the left leg they wrapped with una boot and ace wrap. Do you want the same done for right leg?  Please call the nurse with clarification.

## 2015-04-10 ENCOUNTER — Encounter: Payer: Self-pay | Admitting: *Deleted

## 2015-05-11 ENCOUNTER — Encounter: Payer: Medicare Other | Admitting: Internal Medicine

## 2015-06-11 ENCOUNTER — Telehealth: Payer: Self-pay | Admitting: Internal Medicine

## 2015-06-11 NOTE — Telephone Encounter (Addendum)
Call to patient to confirm appointment for 06/12/15 at 2:15 busy signal °

## 2015-06-12 ENCOUNTER — Encounter: Payer: Self-pay | Admitting: Internal Medicine

## 2015-06-12 ENCOUNTER — Ambulatory Visit (INDEPENDENT_AMBULATORY_CARE_PROVIDER_SITE_OTHER): Payer: Medicare Other | Admitting: Internal Medicine

## 2015-06-12 VITALS — BP 155/89 | HR 69 | Temp 98.1°F | Ht 69.0 in | Wt 200.7 lb

## 2015-06-12 DIAGNOSIS — Z6839 Body mass index (BMI) 39.0-39.9, adult: Secondary | ICD-10-CM

## 2015-06-12 DIAGNOSIS — E11329 Type 2 diabetes mellitus with mild nonproliferative diabetic retinopathy without macular edema: Secondary | ICD-10-CM | POA: Diagnosis present

## 2015-06-12 DIAGNOSIS — E1122 Type 2 diabetes mellitus with diabetic chronic kidney disease: Secondary | ICD-10-CM | POA: Diagnosis not present

## 2015-06-12 DIAGNOSIS — I129 Hypertensive chronic kidney disease with stage 1 through stage 4 chronic kidney disease, or unspecified chronic kidney disease: Secondary | ICD-10-CM | POA: Diagnosis not present

## 2015-06-12 DIAGNOSIS — E785 Hyperlipidemia, unspecified: Secondary | ICD-10-CM

## 2015-06-12 DIAGNOSIS — E559 Vitamin D deficiency, unspecified: Secondary | ICD-10-CM | POA: Diagnosis present

## 2015-06-12 DIAGNOSIS — I10 Essential (primary) hypertension: Secondary | ICD-10-CM

## 2015-06-12 DIAGNOSIS — E113299 Type 2 diabetes mellitus with mild nonproliferative diabetic retinopathy without macular edema, unspecified eye: Secondary | ICD-10-CM

## 2015-06-12 DIAGNOSIS — Z0000 Encounter for general adult medical examination without abnormal findings: Secondary | ICD-10-CM

## 2015-06-12 DIAGNOSIS — Z8639 Personal history of other endocrine, nutritional and metabolic disease: Secondary | ICD-10-CM

## 2015-06-12 DIAGNOSIS — N184 Chronic kidney disease, stage 4 (severe): Secondary | ICD-10-CM

## 2015-06-12 LAB — CBC WITH DIFFERENTIAL/PLATELET
Basophils Absolute: 0.1 10*3/uL (ref 0.0–0.1)
Basophils Relative: 1 % (ref 0–1)
EOS ABS: 0.8 10*3/uL — AB (ref 0.0–0.7)
Eosinophils Relative: 12 % — ABNORMAL HIGH (ref 0–5)
HEMATOCRIT: 38.6 % — AB (ref 39.0–52.0)
Hemoglobin: 12.5 g/dL — ABNORMAL LOW (ref 13.0–17.0)
Lymphocytes Relative: 23 % (ref 12–46)
Lymphs Abs: 1.6 10*3/uL (ref 0.7–4.0)
MCH: 30.9 pg (ref 26.0–34.0)
MCHC: 32.4 g/dL (ref 30.0–36.0)
MCV: 95.3 fL (ref 78.0–100.0)
MONO ABS: 0.6 10*3/uL (ref 0.1–1.0)
MPV: 9.4 fL (ref 8.6–12.4)
Monocytes Relative: 8 % (ref 3–12)
NEUTROS ABS: 3.9 10*3/uL (ref 1.7–7.7)
Neutrophils Relative %: 56 % (ref 43–77)
PLATELETS: 203 10*3/uL (ref 150–400)
RBC: 4.05 MIL/uL — ABNORMAL LOW (ref 4.22–5.81)
RDW: 14.7 % (ref 11.5–15.5)
WBC: 7 10*3/uL (ref 4.0–10.5)

## 2015-06-12 LAB — LIPID PANEL
CHOLESTEROL: 130 mg/dL (ref 0–200)
HDL: 71 mg/dL (ref >=40)
LDL Cholesterol: 46 mg/dL (ref 0–99)
Total CHOL/HDL Ratio: 1.8 Ratio
Triglycerides: 64 mg/dL (ref <150)
VLDL: 13 mg/dL (ref 0–40)

## 2015-06-12 LAB — COMPLETE METABOLIC PANEL WITH GFR
ALBUMIN: 3.4 g/dL — AB (ref 3.5–5.2)
ALT: 8 U/L (ref 0–53)
AST: 14 U/L (ref 0–37)
Alkaline Phosphatase: 71 U/L (ref 39–117)
BUN: 39 mg/dL — ABNORMAL HIGH (ref 6–23)
CO2: 22 mEq/L (ref 19–32)
Calcium: 8.5 mg/dL (ref 8.4–10.5)
Chloride: 109 mEq/L (ref 96–112)
Creat: 2.59 mg/dL — ABNORMAL HIGH (ref 0.50–1.35)
GFR, Est African American: 25 mL/min — ABNORMAL LOW
GFR, Est Non African American: 21 mL/min — ABNORMAL LOW
Glucose, Bld: 138 mg/dL — ABNORMAL HIGH (ref 70–99)
Potassium: 4.3 mEq/L (ref 3.5–5.3)
Sodium: 142 mEq/L (ref 135–145)
Total Bilirubin: 0.4 mg/dL (ref 0.2–1.2)
Total Protein: 6.5 g/dL (ref 6.0–8.3)

## 2015-06-12 LAB — POCT GLYCOSYLATED HEMOGLOBIN (HGB A1C): Hemoglobin A1C: 6

## 2015-06-12 LAB — TSH: TSH: 0.63 u[IU]/mL (ref 0.350–4.500)

## 2015-06-12 LAB — PHOSPHORUS: Phosphorus: 4 mg/dL (ref 2.3–4.6)

## 2015-06-12 LAB — GLUCOSE, CAPILLARY: Glucose-Capillary: 148 mg/dL — ABNORMAL HIGH (ref 65–99)

## 2015-06-12 MED ORDER — BENAZEPRIL HCL 20 MG PO TABS: ORAL_TABLET | ORAL | Status: DC

## 2015-06-12 MED ORDER — GLIPIZIDE 5 MG PO TABS: 2.5000 mg | ORAL_TABLET | Freq: Every day | ORAL | Status: AC

## 2015-06-12 MED ORDER — FUROSEMIDE 40 MG PO TABS: ORAL_TABLET | ORAL | Status: AC

## 2015-06-12 MED ORDER — SIMVASTATIN 20 MG PO TABS: 20.0000 mg | ORAL_TABLET | Freq: Every day | ORAL | Status: AC

## 2015-06-12 MED ORDER — TAMSULOSIN HCL 0.4 MG PO CAPS: 0.4000 mg | ORAL_CAPSULE | Freq: Every day | ORAL | Status: AC

## 2015-06-12 MED ORDER — AMLODIPINE BESYLATE 5 MG PO TABS: 5.0000 mg | ORAL_TABLET | Freq: Every day | ORAL | Status: DC

## 2015-06-12 NOTE — Assessment & Plan Note (Addendum)
Assessment: Pt with CKD Stage 4 with baseline Cr 2.3 who presents with normal urine output   Plan:  -Obtain CMP, CBC w/diff, PTH, phosphorus, and 25-OH vitamin D level  -Obtain urine microalbumin test -Avoid nephrotoxins  -Continue benazepril 20 mg daily for proteinuria, consider increasing at next visit if blood pressure uncontrolled

## 2015-06-12 NOTE — Assessment & Plan Note (Addendum)
Assessment: Pt with vitamin D deficiency level of 16 on 06/18/11 who presents with no recent fall or fracture.   Plan:  -Obtain 25-OH vitamin D level   ADDENDUM on 06/14/15: 25-OH vitamin D level of 15. Pt instructed to start ergocalciferol 50K U weekly for 8 weeks with repeat levels after completion of therapy.

## 2015-06-12 NOTE — Progress Notes (Signed)
Internal Medicine Clinic Attending  Case discussed with Dr. Rabbani at the time of the visit.  We reviewed the resident's history and exam and pertinent patient test results.  I agree with the assessment, diagnosis, and plan of care documented in the resident's note.  

## 2015-06-12 NOTE — Patient Instructions (Addendum)
-  Your diabetes has improved from A1c 6.6 to 6! Decrease your glipizide to 2.5 mg daily.   -Please get an eye exam, will check your feet today -I refilled your norvasc -Will check your bloodwork today and call you with the results -Please come back in 6 months, very nice meeting you!  General Instructions:   Please bring your medicines with you each time you come to clinic.  Medicines may include prescription medications, over-the-counter medications, herbal remedies, eye drops, vitamins, or other pills.   Progress Toward Treatment Goals:  Treatment Goal 02/16/2015  Hemoglobin A1C deteriorated  Blood pressure unchanged    Self Care Goals & Plans:  Self Care Goal 06/12/2015  Manage my medications take my medicines as prescribed; bring my medications to every visit; refill my medications on time  Monitor my health check my feet daily  Eat healthy foods eat more vegetables  Be physically active find an activity I enjoy  Meeting treatment goals -    Home Blood Glucose Monitoring 10/13/2014  Check my blood sugar no home glucose monitoring     Care Management & Community Referrals:  Referral 10/13/2014  Referrals made for care management support social worker

## 2015-06-12 NOTE — Assessment & Plan Note (Addendum)
Assessment: Pt with hyperlipidemia with last lipid panel on 02/05/14 with LDL 50 compliant with moderate-intensity statin therapy with no calculated 10-yr ASCVD score due to age [age].   Plan: -Obtain annual lipid panel  -Continue simvastatin 20 mg daily in setting of DM, CAD, and CVA -Obtain CMP -Continue to monitor for myalgias

## 2015-06-12 NOTE — Assessment & Plan Note (Signed)
Assessment: Pt with history of hyperthyroidism s/p RAI in 2002 with last TSH normal on 01/01/12 who presents with no symptoms of thyroid dysfunction.   Plan:  -Obtain TSH level

## 2015-06-12 NOTE — Assessment & Plan Note (Signed)
-  Obtain screening HIV Ab  -Pt declined tdap and zoster vaccinations -Pt outside screening age for colonoscopy, will defer testing

## 2015-06-12 NOTE — Assessment & Plan Note (Addendum)
Assessment: Pt with last A1c of 6.6 on 01/27/15 compliant with oral hypoglycemic therapy with no recent symptomatic hypoglycemia who presents with CBG of 148 and improved A1c of 6.   Plan:  -A1c 6 at goal <7, decrease glipizide 10 mg BID to 2.5 mg daily -BP 155/89 not at goal<140/90, continue norvasc 5 mg daily, lasix 40 mg daily, lopressor 12.5 mg BID, benazepril 20 mg daily, and flomax 0.4 mg daily. Consider increasing benazepril 20 mg daily to 40 mg daily at next visit if still uncontrolled. -Obtain annual lipid panel, last LDL 50 at goal <100, continue simvastatin 20 mg daily  -Obtain annual urine microalbumin test  -Peform annual foot exam -Last annual eye exam on 06/12/12 with b/l mild proliferative diabetic retinopathy. Pt to schedule annual eye exam with ophthalmologist -BMI 29.62 not at goal <25, encourage weight loss

## 2015-06-12 NOTE — Progress Notes (Signed)
Patient ID: Jeremy Sanders, male   DOB: 1925/10/22, 81 y.o.   MRN: [id number]    Subjective:   Patient ID: Jeremy Sanders male   DOB: 1925/10/22 [age] y.o.   MRN: [id number]  HPI: Mr.Jeremy Mort SawyersSalvador is a 81 y.o. pleasant man with past medical history of non-insulin dependent Type 2 DM, hypertension, hyperlipidemia, CKD Stage 4, CAD s/p CABG, CVA, chronic venous insufficiency, urinary incontinence, and GERD who presents for routine follow-up.  His last A1c was 6.6 on 01/27/15. He reports compliance with taking glipizide 10 mg BID. He does not check his blood sugar at home. He denies symptomatic hypoglycemia. He has chronic polydipsia and polyuria but denies blurry vision, polyphagia, peripheral neuropathy, or foot injury/ulceration. He tries to follow a healthy diet and walks regularly. He has lost 5 lb since last visit 4 months ago.   He reports compliance with taking lasix, norvasc, lopressor, benazepril, and flomax for hypertension.  He has chronic b/l LE edema but denies headache, chest pain, or lightheadedness.    He reports compliance with taking zocor 20 mg daily for hyperlipidemia. He denies myalgias.   He has history of hyperthyroidism s/p RAI therapy in 2002. His last TSH level on 01/01/12 was normal.    He has history of vitamin D deficiency with level of 16 on 06/18/11. He is not on supplementation. He denies recent fall or fracture.    Past Medical History  Diagnosis Date  . Diabetes mellitus   . Hypertension   . Hyperlipidemia   . Cholelithiasis     Dr. Matthias HughsBuccini  . CAD (coronary artery disease)     s/p CABG in 1997. Dr. Juanda ChanceBrodie.  . Venous insufficiency of leg   . BPH (benign prostatic hyperplasia)     Dr. Isabel CapriceGrapey  . Eosinophilia 2005    Since 2005  . Diverticulosis 2005    GI bleed 02/2004  . Insomnia   . Chronic kidney disease     Cr around 2 since 2005  . Hyperthyroidism     persistently low TSH. RAI treatement in 2002. Reffered to Dr. Ardyth HarpsSteve South in 2007.  Marland Kitchen. Proteinuria  2007   Current Outpatient Prescriptions  Medication Sig Dispense Refill  . acetaminophen (TYLENOL) 325 MG tablet Take 650 mg by mouth as needed for pain.    Marland Kitchen. amLODipine (NORVASC) 5 MG tablet Take 1 tablet (5 mg total) by mouth daily. 100 tablet 3  . benazepril (LOTENSIN) 20 MG tablet TAKE 1 TABLET BY MOUTH ONCE DAILY 100 tablet 3  . dipyridamole-aspirin (AGGRENOX) 200-25 MG per 12 hr capsule Take 1 capsule by mouth 2 (two) times daily. 200 capsule 3  . Elastic Bandages & Supports (MEDICAL COMPRESSION SOCKS) MISC Apply to both legs up to your knees bilaterally every day. 2 each 1  . furosemide (LASIX) 40 MG tablet Take 2 tabs every day 200 tablet 3  . glipiZIDE (GLUCOTROL) 10 MG tablet Take 1 tablet (10 mg total) by mouth 2 (two) times daily before a meal. 200 tablet 3  . metoprolol tartrate (LOPRESSOR) 25 MG tablet Take 0.5 tablets (12.5 mg total) by mouth 2 (two) times daily. 100 tablet 3  . nitroGLYCERIN (NITROSTAT) 0.4 MG SL tablet Place 0.4 mg under the tongue every 5 (five) minutes as needed. For chest pain    . omeprazole (PRILOSEC) 20 MG capsule Take 1 capsule (20 mg total) by mouth every other day. 100 capsule 3  . simvastatin (ZOCOR) 20 MG tablet Take 1 tablet (20 mg total) by mouth  at bedtime. . 100 tablet 3  . tamsulosin (FLOMAX) 0.4 MG CAPS capsule Take 1 capsule (0.4 mg total) by mouth daily. 100 capsule 3   No current facility-administered medications for this visit.   No family history on file. History   Social History  . Marital Status: Married    Spouse Name: N/A  . Number of Children: N/A  . Years of Education: N/A   Social History Main Topics  . Smoking status: Former Games developer  . Smokeless tobacco: Not on file  . Alcohol Use: Not on file  . Drug Use: Not on file  . Sexual Activity: Not on file   Other Topics Concern  . Not on file   Social History Narrative   Originally from [place]. Has been a world traveler.    Unclear about his career by appears its a  comfortable retirement with his wife in [place].    Review of Systems: Review of Systems  Constitutional: Positive for weight loss (intentional ).  Eyes: Negative for blurred vision.  Respiratory: Negative for cough, shortness of breath and wheezing.   Cardiovascular: Positive for leg swelling (chronic b/l LE). Negative for chest pain.  Gastrointestinal: Positive for heartburn (controlled on PPI). Negative for nausea, vomiting, abdominal pain, diarrhea, constipation and blood in stool.  Genitourinary: Negative for dysuria, urgency, frequency and hematuria.       Polyuria and urinary incontinence   Musculoskeletal: Negative for myalgias and falls.  Neurological: Negative for dizziness, sensory change and headaches.  Endo/Heme/Allergies: Positive for polydipsia.    Objective:  Physical Exam: Filed Vitals:   06/12/15 1435  BP: 155/89  Pulse: 69  Temp: 98.1 F (36.7 C)  TempSrc: Oral  Height:  (1.753 m)  Weight: 200 lb 11.2 oz (91.037 kg)  SpO2: 97%    Physical Exam  Constitutional: He is oriented to person, place, and time. He appears well-developed and well-nourished. No distress.  HENT:  Head: Normocephalic and atraumatic.  Right Ear: External ear normal.  Left Ear: External ear normal.  Nose: Nose normal.  Mouth/Throat: Oropharynx is clear and moist. No oropharyngeal exudate.  Eyes: Conjunctivae and EOM are normal. Pupils are equal, round, and reactive to light. Right eye exhibits no discharge. Left eye exhibits no discharge. No scleral icterus.  Neck: Normal range of motion. Neck supple.  Cardiovascular: Normal rate, regular rhythm and normal heart sounds.   Pulmonary/Chest: Effort normal and breath sounds normal. No respiratory distress. He has no wheezes. He has no rales.  Abdominal: Soft. Bowel sounds are normal. He exhibits no distension. There is no tenderness. There is no rebound and no guarding.  Musculoskeletal: Normal range of motion. He exhibits no  tenderness. Edema: b/l LE +2 pitting edema (chronic)  Neurological: He is alert and oriented to person, place, and time.  Skin: Skin is warm and dry. No rash noted. He is not diaphoretic. No erythema. No pallor.  Psychiatric: He has a normal mood and affect. His behavior is normal. Judgment and thought content normal.    Assessment & Plan:   Please see problem list for problem-based assessment and plan

## 2015-06-12 NOTE — Assessment & Plan Note (Signed)
Assessment: Pt with moderately well-controlled hypertension compliant with five-class (BB, CCB, diuretic, ACEi, and alpha-1 blocker) anti-hypertensive therapy who presents with blood pressure of 155/89.  Plan: -BP 155/89 not at goal<140/90 -Continue norvasc 5 mg daily, lasix 40 mg daily, lopressor 12.5 mg BID, benazepril 20 mg daily, and flomax 0.4 mg daily -Consider increasing benazepril 20 mg daily to 40 mg daily at next visit if still uncontrolled -Obtain CMP

## 2015-06-13 ENCOUNTER — Other Ambulatory Visit: Payer: Self-pay | Admitting: Internal Medicine

## 2015-06-13 LAB — PTH, INTACT AND CALCIUM
Calcium, Total (PTH): 8.9 mg/dL (ref 8.6–10.2)
PTH: 276 pg/mL — ABNORMAL HIGH (ref 15–65)

## 2015-06-13 LAB — VITAMIN D 25 HYDROXY (VIT D DEFICIENCY, FRACTURES): Vit D, 25-Hydroxy: 15 ng/mL — ABNORMAL LOW (ref 30–100)

## 2015-06-13 LAB — MICROALBUMIN / CREATININE URINE RATIO
CREATININE, URINE: 101.9 mg/dL
MICROALB UR: 157.8 mg/dL — AB (ref <2.0)
MICROALB/CREAT RATIO: 1548.6 mg/g — AB (ref 0.0–30.0)

## 2015-06-13 LAB — HIV ANTIBODY (ROUTINE TESTING W REFLEX): HIV 1&2 Ab, 4th Generation: NONREACTIVE

## 2015-06-14 MED ORDER — VITAMIN D (ERGOCALCIFEROL) 1.25 MG (50000 UNIT) PO CAPS: 50000.0000 [IU] | ORAL_CAPSULE | ORAL | Status: DC

## 2015-06-14 NOTE — Addendum Note (Signed)
Addended byOtis Brace: Wheeler Incorvaia on: 06/14/2015 01:13 AM   Modules accepted: Orders

## 2015-06-15 ENCOUNTER — Telehealth: Payer: Self-pay | Admitting: Dietician

## 2015-06-16 ENCOUNTER — Encounter: Payer: Self-pay | Admitting: Internal Medicine

## 2015-06-18 NOTE — Telephone Encounter (Signed)
Thanks for calling him about this Lupita Leash.  Dr. Johna Roles

## 2015-06-18 NOTE — Telephone Encounter (Signed)
Patient says Dr. Elmer Picker does not want to see him anymore, she only wants to do cataract surgery and he dies not want that. He says he has another eye doctor he can see and does not want help making an appointment

## 2015-07-17 ENCOUNTER — Other Ambulatory Visit: Payer: Self-pay | Admitting: Internal Medicine

## 2015-11-19 ENCOUNTER — Other Ambulatory Visit: Payer: Self-pay | Admitting: Internal Medicine

## 2016-01-27 ENCOUNTER — Encounter: Payer: Self-pay | Admitting: *Deleted

## 2016-02-21 ENCOUNTER — Emergency Department (HOSPITAL_COMMUNITY): Payer: Medicare Other

## 2016-02-21 ENCOUNTER — Encounter (HOSPITAL_COMMUNITY): Payer: Self-pay | Admitting: *Deleted

## 2016-02-21 ENCOUNTER — Inpatient Hospital Stay (HOSPITAL_COMMUNITY)
Admission: EM | Admit: 2016-02-21 | Discharge: 2016-02-24 | DRG: 377 | Disposition: A | Discharge status: Skilled Nursing Facility | Payer: Medicare Other | Attending: Oncology | Admitting: Oncology

## 2016-02-21 DIAGNOSIS — Z66 Do not resuscitate: Secondary | ICD-10-CM | POA: Diagnosis present

## 2016-02-21 DIAGNOSIS — I2510 Atherosclerotic heart disease of native coronary artery without angina pectoris: Secondary | ICD-10-CM | POA: Diagnosis present

## 2016-02-21 DIAGNOSIS — N184 Chronic kidney disease, stage 4 (severe): Secondary | ICD-10-CM | POA: Diagnosis present

## 2016-02-21 DIAGNOSIS — R600 Localized edema: Secondary | ICD-10-CM | POA: Diagnosis not present

## 2016-02-21 DIAGNOSIS — D649 Anemia, unspecified: Secondary | ICD-10-CM | POA: Diagnosis not present

## 2016-02-21 DIAGNOSIS — I5022 Chronic systolic (congestive) heart failure: Secondary | ICD-10-CM | POA: Diagnosis present

## 2016-02-21 DIAGNOSIS — E113299 Type 2 diabetes mellitus with mild nonproliferative diabetic retinopathy without macular edema, unspecified eye: Secondary | ICD-10-CM | POA: Diagnosis present

## 2016-02-21 DIAGNOSIS — E1122 Type 2 diabetes mellitus with diabetic chronic kidney disease: Secondary | ICD-10-CM | POA: Diagnosis present

## 2016-02-21 DIAGNOSIS — L02229 Furuncle of trunk, unspecified: Secondary | ICD-10-CM | POA: Diagnosis present

## 2016-02-21 DIAGNOSIS — Z8673 Personal history of transient ischemic attack (TIA), and cerebral infarction without residual deficits: Secondary | ICD-10-CM | POA: Diagnosis not present

## 2016-02-21 DIAGNOSIS — E785 Hyperlipidemia, unspecified: Secondary | ICD-10-CM | POA: Diagnosis present

## 2016-02-21 DIAGNOSIS — N2581 Secondary hyperparathyroidism of renal origin: Secondary | ICD-10-CM | POA: Diagnosis present

## 2016-02-21 DIAGNOSIS — I130 Hypertensive heart and chronic kidney disease with heart failure and stage 1 through stage 4 chronic kidney disease, or unspecified chronic kidney disease: Secondary | ICD-10-CM | POA: Diagnosis present

## 2016-02-21 DIAGNOSIS — Z951 Presence of aortocoronary bypass graft: Secondary | ICD-10-CM

## 2016-02-21 DIAGNOSIS — R079 Chest pain, unspecified: Secondary | ICD-10-CM | POA: Diagnosis not present

## 2016-02-21 DIAGNOSIS — K625 Hemorrhage of anus and rectum: Secondary | ICD-10-CM | POA: Diagnosis present

## 2016-02-21 DIAGNOSIS — D500 Iron deficiency anemia secondary to blood loss (chronic): Secondary | ICD-10-CM | POA: Diagnosis present

## 2016-02-21 DIAGNOSIS — I214 Non-ST elevation (NSTEMI) myocardial infarction: Secondary | ICD-10-CM | POA: Diagnosis present

## 2016-02-21 DIAGNOSIS — I872 Venous insufficiency (chronic) (peripheral): Secondary | ICD-10-CM | POA: Diagnosis present

## 2016-02-21 DIAGNOSIS — E0590 Thyrotoxicosis, unspecified without thyrotoxic crisis or storm: Secondary | ICD-10-CM | POA: Diagnosis present

## 2016-02-21 DIAGNOSIS — K922 Gastrointestinal hemorrhage, unspecified: Secondary | ICD-10-CM | POA: Diagnosis present

## 2016-02-21 DIAGNOSIS — D62 Acute posthemorrhagic anemia: Secondary | ICD-10-CM | POA: Diagnosis present

## 2016-02-21 DIAGNOSIS — Z79899 Other long term (current) drug therapy: Secondary | ICD-10-CM

## 2016-02-21 DIAGNOSIS — N400 Enlarged prostate without lower urinary tract symptoms: Secondary | ICD-10-CM | POA: Diagnosis present

## 2016-02-21 DIAGNOSIS — N179 Acute kidney failure, unspecified: Secondary | ICD-10-CM | POA: Diagnosis present

## 2016-02-21 DIAGNOSIS — Z87891 Personal history of nicotine dependence: Secondary | ICD-10-CM | POA: Diagnosis not present

## 2016-02-21 DIAGNOSIS — L89151 Pressure ulcer of sacral region, stage 1: Secondary | ICD-10-CM | POA: Diagnosis present

## 2016-02-21 DIAGNOSIS — E559 Vitamin D deficiency, unspecified: Secondary | ICD-10-CM | POA: Diagnosis present

## 2016-02-21 DIAGNOSIS — K921 Melena: Secondary | ICD-10-CM

## 2016-02-21 DIAGNOSIS — I209 Angina pectoris, unspecified: Secondary | ICD-10-CM | POA: Diagnosis not present

## 2016-02-21 DIAGNOSIS — L8990 Pressure ulcer of unspecified site, unspecified stage: Secondary | ICD-10-CM | POA: Diagnosis present

## 2016-02-21 DIAGNOSIS — Z7984 Long term (current) use of oral hypoglycemic drugs: Secondary | ICD-10-CM | POA: Diagnosis not present

## 2016-02-21 DIAGNOSIS — K5730 Diverticulosis of large intestine without perforation or abscess without bleeding: Secondary | ICD-10-CM | POA: Diagnosis present

## 2016-02-21 DIAGNOSIS — I10 Essential (primary) hypertension: Secondary | ICD-10-CM

## 2016-02-21 LAB — COMPREHENSIVE METABOLIC PANEL
ALBUMIN: 2.7 g/dL — AB (ref 3.5–5.0)
ALK PHOS: 60 U/L (ref 38–126)
ALT: 15 U/L — AB (ref 17–63)
ALT: 18 U/L (ref 17–63)
ANION GAP: 12 (ref 5–15)
AST: 70 U/L — AB (ref 15–41)
AST: 90 U/L — ABNORMAL HIGH (ref 15–41)
Albumin: 2.5 g/dL — ABNORMAL LOW (ref 3.5–5.0)
Alkaline Phosphatase: 54 U/L (ref 38–126)
Anion gap: 10 (ref 5–15)
BUN: 55 mg/dL — AB (ref 6–20)
BUN: 54 mg/dL — ABNORMAL HIGH (ref 6–20)
CALCIUM: 8.2 mg/dL — AB (ref 8.9–10.3)
CHLORIDE: 115 mmol/L — AB (ref 101–111)
CHLORIDE: 115 mmol/L — AB (ref 101–111)
CO2: 16 mmol/L — AB (ref 22–32)
CO2: 14 mmol/L — AB (ref 22–32)
CREATININE: 4.22 mg/dL — AB (ref 0.61–1.24)
Calcium: 8 mg/dL — ABNORMAL LOW (ref 8.9–10.3)
Creatinine, Ser: 4.11 mg/dL — ABNORMAL HIGH (ref 0.61–1.24)
GFR calc Af Amer: 13 mL/min — ABNORMAL LOW (ref >60)
GFR calc non Af Amer: 12 mL/min — ABNORMAL LOW (ref >60)
GFR, EST AFRICAN AMERICAN: 14 mL/min — AB (ref >60)
GFR, EST NON AFRICAN AMERICAN: 11 mL/min — AB (ref >60)
Glucose, Bld: 183 mg/dL — ABNORMAL HIGH (ref 65–99)
Glucose, Bld: 156 mg/dL — ABNORMAL HIGH (ref 65–99)
POTASSIUM: 4.5 mmol/L (ref 3.5–5.1)
POTASSIUM: 4.3 mmol/L (ref 3.5–5.1)
SODIUM: 141 mmol/L (ref 135–145)
Sodium: 141 mmol/L (ref 135–145)
TOTAL PROTEIN: 5.1 g/dL — AB (ref 6.5–8.1)
Total Bilirubin: 0.4 mg/dL (ref 0.3–1.2)
Total Bilirubin: 0.7 mg/dL (ref 0.3–1.2)
Total Protein: 5.4 g/dL — ABNORMAL LOW (ref 6.5–8.1)

## 2016-02-21 LAB — CBC WITH DIFFERENTIAL/PLATELET
BASOS PCT: 0 %
Basophils Absolute: 0 10*3/uL (ref 0.0–0.1)
EOS ABS: 0.1 10*3/uL (ref 0.0–0.7)
Eosinophils Relative: 1 %
HCT: 21.5 % — ABNORMAL LOW (ref 39.0–52.0)
Hemoglobin: 6.8 g/dL — CL (ref 13.0–17.0)
Lymphocytes Relative: 20 %
Lymphs Abs: 1.8 10*3/uL (ref 0.7–4.0)
MCH: 30.2 pg (ref 26.0–34.0)
MCHC: 31.6 g/dL (ref 30.0–36.0)
MCV: 95.6 fL (ref 78.0–100.0)
MONO ABS: 0.6 10*3/uL (ref 0.1–1.0)
MONOS PCT: 6 %
NEUTROS PCT: 72 %
Neutro Abs: 6.8 10*3/uL (ref 1.7–7.7)
Platelets: 178 10*3/uL (ref 150–400)
RBC: 2.25 MIL/uL — ABNORMAL LOW (ref 4.22–5.81)
RDW: 14.7 % (ref 11.5–15.5)
WBC: 9.4 10*3/uL (ref 4.0–10.5)

## 2016-02-21 LAB — URINALYSIS, ROUTINE W REFLEX MICROSCOPIC
Bilirubin Urine: NEGATIVE
Glucose, UA: 250 mg/dL — AB
KETONES UR: NEGATIVE mg/dL
LEUKOCYTES UA: NEGATIVE
NITRITE: NEGATIVE
PH: 5 (ref 5.0–8.0)
Protein, ur: 100 mg/dL — AB
SPECIFIC GRAVITY, URINE: 1.016 (ref 1.005–1.030)

## 2016-02-21 LAB — I-STAT CHEM 8, ED
BUN: 54 mg/dL — ABNORMAL HIGH (ref 6–20)
Calcium, Ion: 1.09 mmol/L — ABNORMAL LOW (ref 1.13–1.30)
Chloride: 113 mmol/L — ABNORMAL HIGH (ref 101–111)
Creatinine, Ser: 4.1 mg/dL — ABNORMAL HIGH (ref 0.61–1.24)
GLUCOSE: 173 mg/dL — AB (ref 65–99)
HCT: 21 % — ABNORMAL LOW (ref 39.0–52.0)
HEMOGLOBIN: 7.1 g/dL — AB (ref 13.0–17.0)
POTASSIUM: 4.5 mmol/L (ref 3.5–5.1)
Sodium: 142 mmol/L (ref 135–145)
TCO2: 17 mmol/L (ref 0–100)

## 2016-02-21 LAB — GLUCOSE, CAPILLARY: GLUCOSE-CAPILLARY: 126 mg/dL — AB (ref 65–99)

## 2016-02-21 LAB — I-STAT TROPONIN, ED: Troponin i, poc: >35 ng/mL (ref 0.00–0.08)

## 2016-02-21 LAB — CBC
HCT: 28.4 % — ABNORMAL LOW (ref 39.0–52.0)
Hemoglobin: 9 g/dL — ABNORMAL LOW (ref 13.0–17.0)
MCH: 29.7 pg (ref 26.0–34.0)
MCHC: 31.7 g/dL (ref 30.0–36.0)
MCV: 93.7 fL (ref 78.0–100.0)
PLATELETS: 170 10*3/uL (ref 150–400)
RBC: 3.03 MIL/uL — AB (ref 4.22–5.81)
RDW: 15.9 % — AB (ref 11.5–15.5)
WBC: 12.1 10*3/uL — AB (ref 4.0–10.5)

## 2016-02-21 LAB — PROTIME-INR
INR: 1.21 (ref 0.00–1.49)
PROTHROMBIN TIME: 15.5 s — AB (ref 11.6–15.2)

## 2016-02-21 LAB — TROPONIN I
TROPONIN I: 48.04 ng/mL — AB (ref <0.031)
Troponin I: 32.5 ng/mL (ref <0.031)

## 2016-02-21 LAB — CBG MONITORING, ED
GLUCOSE-CAPILLARY: 149 mg/dL — AB (ref 65–99)
Glucose-Capillary: 128 mg/dL — ABNORMAL HIGH (ref 65–99)

## 2016-02-21 LAB — BRAIN NATRIURETIC PEPTIDE: B NATRIURETIC PEPTIDE 5: 1537.2 pg/mL — AB (ref 0.0–100.0)

## 2016-02-21 LAB — POC OCCULT BLOOD, ED: Fecal Occult Bld: POSITIVE — AB

## 2016-02-21 LAB — URINE MICROSCOPIC-ADD ON
BACTERIA UA: NONE SEEN
WBC UA: NONE SEEN WBC/hpf (ref 0–5)

## 2016-02-21 LAB — ABO/RH: ABO/RH(D): O POS

## 2016-02-21 LAB — MRSA PCR SCREENING: MRSA BY PCR: NEGATIVE

## 2016-02-21 LAB — I-STAT CG4 LACTIC ACID, ED: LACTIC ACID, VENOUS: 1.77 mmol/L (ref 0.5–2.0)

## 2016-02-21 LAB — PREPARE RBC (CROSSMATCH)

## 2016-02-21 MED ORDER — SODIUM CHLORIDE 0.9 % IV SOLN: INTRAVENOUS | Status: DC

## 2016-02-21 MED ORDER — CLONIDINE HCL 0.1 MG PO TABS
0.1000 mg | ORAL_TABLET | Freq: Three times a day (TID) | ORAL | Status: DC
  Administered 2016-02-21 – 2016-02-22 (×3): 0.1 mg via ORAL
  Filled 2016-02-21 (×3): qty 1

## 2016-02-21 MED ORDER — ONDANSETRON HCL 4 MG/2ML IJ SOLN: 4.0000 mg | Freq: Four times a day (QID) | INTRAMUSCULAR | Status: DC | PRN

## 2016-02-21 MED ORDER — ACETAMINOPHEN 325 MG PO TABS: 650.0000 mg | ORAL_TABLET | ORAL | Status: DC | PRN

## 2016-02-21 MED ORDER — AMLODIPINE BESYLATE 5 MG PO TABS
5.0000 mg | ORAL_TABLET | Freq: Every day | ORAL | Status: DC
  Administered 2016-02-22 – 2016-02-24 (×2): 5 mg via ORAL
  Filled 2016-02-21 (×3): qty 1

## 2016-02-21 MED ORDER — SODIUM CHLORIDE 0.9 % IV SOLN: 250.0000 mL | INTRAVENOUS | Status: DC | PRN

## 2016-02-21 MED ORDER — INSULIN ASPART 100 UNIT/ML ~~LOC~~ SOLN
0.0000 [IU] | SUBCUTANEOUS | Status: DC
  Administered 2016-02-21 – 2016-02-22 (×3): 2 [IU] via SUBCUTANEOUS
  Administered 2016-02-22 (×2): 3 [IU] via SUBCUTANEOUS
  Filled 2016-02-21: qty 1

## 2016-02-21 MED ORDER — SODIUM CHLORIDE 0.9 % IV BOLUS (SEPSIS): 500.0000 mL | Freq: Once | INTRAVENOUS | Status: DC

## 2016-02-21 MED ORDER — CETYLPYRIDINIUM CHLORIDE 0.05 % MT LIQD
7.0000 mL | Freq: Two times a day (BID) | OROMUCOSAL | Status: DC
  Administered 2016-02-21 – 2016-02-23 (×3): 7 mL via OROMUCOSAL

## 2016-02-21 MED ORDER — SODIUM CHLORIDE 0.9 % IV SOLN
Freq: Once | INTRAVENOUS | Status: AC
  Administered 2016-02-21: 11:00:00 via INTRAVENOUS

## 2016-02-21 MED ORDER — PANTOPRAZOLE SODIUM 40 MG IV SOLR
40.0000 mg | Freq: Every day | INTRAVENOUS | Status: DC
  Administered 2016-02-21 – 2016-02-22 (×2): 40 mg via INTRAVENOUS
  Filled 2016-02-21 (×2): qty 40

## 2016-02-21 MED ORDER — METOPROLOL TARTRATE 12.5 MG HALF TABLET
12.5000 mg | ORAL_TABLET | Freq: Two times a day (BID) | ORAL | Status: DC
  Administered 2016-02-22 – 2016-02-24 (×4): 12.5 mg via ORAL
  Filled 2016-02-21 (×5): qty 1

## 2016-02-21 NOTE — Progress Notes (Signed)
Troponin 48.04 at this time. MD not notified - expected results. Will continue to monitor.

## 2016-02-21 NOTE — Consult Note (Signed)
Referring Provider: Dr. Kendrick Fries Primary Care Physician:  Otis Brace, MD Primary Gastroenterologist:  Gentry Fitz  Reason for Consultation:  GI bleed  HPI: Jeremy Sanders is a 82 y.o. male who came to the ER with chest pain and was found to have an elevated Troponin of 32 and Hgb 6.8. He reports having "nine" episodes of BRBPR this past Friday with brown stools. Denies abdominal pain, nausea, vomiting, hematemesis, or dizziness. He arrived in the ER in a shirt covered in old blood. Black stools on rectal by nursing. Red blood soiled up his back upon arrival. He thinks he has had a colonoscopy in the past but is not sure. Takes Naproxen and Aggrenox. Chart report says he is really 82 yo (see admit H/P for details) and lives with his wife at home.    Past Medical History  Diagnosis Date  . Diabetes mellitus   . Hypertension   . Hyperlipidemia   . Cholelithiasis     Dr. Matthias Hughs  . CAD (coronary artery disease)     s/p CABG in 1997. Dr. Juanda Chance.  . Venous insufficiency of leg   . BPH (benign prostatic hyperplasia)     Dr. Isabel Caprice  . Eosinophilia 2005    Since 2005  . Diverticulosis 2005    GI bleed 02/2004  . Insomnia   . Chronic kidney disease     Cr around 2 since 2005  . Hyperthyroidism     persistently low TSH. RAI treatement in 2002. Reffered to Dr. Ardyth Harps in 2007.  Marland Kitchen Proteinuria 2007    Past Surgical History  Procedure Laterality Date  . Coronary artery bypass graft      1997    Prior to Admission medications   Medication Sig Start Date End Date Taking? Authorizing Provider  acetaminophen (TYLENOL) 325 MG tablet Take 650 mg by mouth as needed for pain.   Yes Historical Provider, MD  amLODipine (NORVASC) 5 MG tablet Take 1 tablet (5 mg total) by mouth daily. 06/12/15  Yes Marjan Rabbani, MD  benazepril (LOTENSIN) 20 MG tablet TAKE 1 TABLET BY MOUTH ONCE DAILY 06/12/15  Yes Marjan Rabbani, MD  dipyridamole-aspirin (AGGRENOX) 200-25 MG per 12 hr capsule TAKE 1 CAPSULE  BY MOUTH 2 (TWO) TIMES DAILY. 07/20/15  Yes Otis Brace, MD  furosemide (LASIX) 40 MG tablet Take 2 tabs every day Patient taking differently: Take 20 mg by mouth daily as needed for fluid. Take 2 tabs every day 06/12/15  Yes Marjan Rabbani, MD  glipiZIDE (GLUCOTROL) 5 MG tablet Take 0.5 tablets (2.5 mg total) by mouth daily before breakfast. Patient taking differently: Take 5 mg by mouth daily before breakfast.  06/12/15  Yes Marjan Rabbani, MD  metoprolol tartrate (LOPRESSOR) 25 MG tablet TAKE 1/2 TABLET (12.5 MG) TWICE DAILY 11/19/15  Yes Marjan Rabbani, MD  naproxen sodium (ANAPROX) 220 MG tablet Take 220 mg by mouth every morning.   Yes Historical Provider, MD  nitroGLYCERIN (NITROSTAT) 0.4 MG SL tablet Place 0.4 mg under the tongue every 5 (five) minutes as needed. For chest pain   Yes Historical Provider, MD  omeprazole (PRILOSEC) 20 MG capsule TAKE 1 CAPSULE BY MOUTH EVERY OTHER DAY 07/20/15  Yes Otis Brace, MD  simvastatin (ZOCOR) 20 MG tablet Take 1 tablet (20 mg total) by mouth at bedtime. . 06/12/15  Yes Otis Brace, MD  tamsulosin (FLOMAX) 0.4 MG CAPS capsule Take 1 capsule (0.4 mg total) by mouth daily. 06/12/15  Yes Otis Brace, MD  Elastic Bandages & Supports (MEDICAL  COMPRESSION SOCKS) MISC Apply to both legs up to your knees bilaterally every day. 06/16/14   Ky Barban, MD    Scheduled Meds: . [START ON 02/22/2016] amLODipine  5 mg Oral Daily  . insulin aspart  0-15 Units Subcutaneous 6 times per day  . [START ON 02/22/2016] metoprolol tartrate  12.5 mg Oral BID  . pantoprazole (PROTONIX) IV  40 mg Intravenous QHS   Continuous Infusions: . sodium chloride    . sodium chloride     PRN Meds:.sodium chloride, acetaminophen, ondansetron (ZOFRAN) IV  Allergies as of 02/21/2016  . (No Known Allergies)    History reviewed. No pertinent family history.  Social History   Social History  . Marital Status: Married    Spouse Name: N/A  . Number of Children: N/A   . Years of Education: N/A   Occupational History  . Not on file.   Social History Main Topics  . Smoking status: Former Games developer  . Smokeless tobacco: Not on file  . Alcohol Use: No  . Drug Use: Not on file  . Sexual Activity: Not on file   Other Topics Concern  . Not on file   Social History Narrative   Originally from [place]. Has been a world traveler.    Unclear about his career by appears its a comfortable retirement with his wife in [place].     Review of Systems: All negative except as stated above in HPI.  Physical Exam: Vital signs: Filed Vitals:   02/21/16 1230 02/21/16 1245  BP: 139/85 127/93  Pulse: 81 70  Temp:  98.2  Resp: 23 16     General:   Lethargic, elderly, frail, dry skin, well-developed, no acute distress HEENT: anicteric sclera Neck: supple, nontender Lungs:  Clear throughout to auscultation.   No wheezes, crackles, or rhonchi. No acute distress. Heart: Regular rate and rhythm; no murmurs, clicks, rubs,  or gallops. Abdomen: soft, nontender, nondistended, +BS  Rectal:  Deferred Ext: 3+ LE edema  GI:  Lab Results:  Recent Labs  02/21/16 0945 02/21/16 0958  WBC 9.4  --   HGB 6.8* 7.1*  HCT 21.5* 21.0*  PLT 178  --    BMET  Recent Labs  02/21/16 0945 02/21/16 0958  NA 141 142  K 4.5 4.5  CL 115* 113*  CO2 16*  --   GLUCOSE 183* 173*  BUN 55* 54*  CREATININE 4.22* 4.10*  CALCIUM 8.0*  --    LFT  Recent Labs  02/21/16 0945  PROT 5.1*  ALBUMIN 2.5*  AST 70*  ALT 15*  ALKPHOS 54  BILITOT 0.4   PT/INR  Recent Labs  02/21/16 0945  LABPROT 15.5*  INR 1.21     Studies/Results: Dg Chest 2 View  02/21/2016  CLINICAL DATA:  Possible myocardial infarction. EXAM: CHEST  2 VIEW COMPARISON:  Into 313. FINDINGS: Cardiomegaly. Status post CABG. Linear markings of the lung bases could represent early interstitial edema. No consolidation. No significant pleural effusion. No visible pneumothorax. IMPRESSION: Worsening  aeration from priors. Cardiomegaly. Early interstitial edema is possible. Electronically Signed   By: Elsie Stain M.D.   On: 02/21/2016 10:05    Impression/Plan: 82 yo with GI bleed likely lower in origin. Severe anemia with markedly elevated Troponin and NSTEMI. Suspect GI bleed diverticular in origin. Doubt upper source. Would recommend conservative management and hold off on invasive GI procedures at this time. NPO for now. Supportive care. Will follow.    LOS: 0 days  Omarion Minnehan C.  02/21/2016, 1:57 PM  Pager [phone]  If no answer or after 5 PM call [phone]

## 2016-02-21 NOTE — ED Notes (Signed)
Attempted report to 2S. 

## 2016-02-21 NOTE — ED Notes (Signed)
Pt placed on zoll.

## 2016-02-21 NOTE — H&P (Signed)
PULMONARY / CRITICAL CARE MEDICINE   Name: Jeremy Sanders MRN: [id number] DOB: Jul 22, 1925    ADMISSION DATE:  02/21/2016 CONSULTATION DATE:  02/21/16  REFERRING MD:  Dr. Criss Alvine   CHIEF COMPLAINT:  GIB, NSTEMI, AKI, Symptomatic Anemia   HISTORY OF PRESENT ILLNESS:  82 y/o M, never smoker / ETOH, with PMH of DM, HTN, HLD, CAD s/p CABG, Venous Insufficiency, BPH, insomnia, diverticulosis, CKD (sr cr ~ 2.5 baseline), & hyperthyroidism who presented to [facility] on 3/26 via EMS with complaints of weakness, chest pain and bright red blood per rectum.    At baseline, the patient lives with his 36 y/o wife alone.  They do not have family support.  He walks with a walker at home.  The patient is technically 20 years old - his father listed him as 4 years younger on documented paperwork to avoid being involved in WWII.  He was born [place] and speaks 5 languages.  He typically helps take care of his wife and is concerned about her being alone without him.    The patient reports he has had weakness for two weeks.  2-3 days prior to admit, he began having bright red blood with bowel movements. He also noted chest pressure that was worse with lying down.  He reported it began on 3/26 and by the time EMS arrived, the chest pain had resolved.  EKG notable for new ST depression per EMS.  He was treated with baby ASA x4.  He arrived to the ER in a shirt that was covered in old blood (reportedly, he had been wearing for two days).  He typically wears TED hose for edema and notes that it is no worse than usual.  TED hose removed in ER due to swelling and concern for skin breakdown.    Initial ER evaluation notable for mild work of breathing per notes, LE swelling and stage 1 sacral decubitus.  No further bleeding noted in ER.  EDP reports rectal exam was brown stool but heme positive.  No bleeding noted on rectal exam.  Labs - Na 142, K 4.5, sr cr 4.10 (up from approx baseline of 2.5), AG 10, lactic acid 1.77, BNP  1537, troponin >35, WBC 9.4, Hgb 6.8 and platelets 178.  Initial CXR with mild interstitial edema, no effusions or overt CHF.    PCCM consulted for evaluation / admission.   PAST MEDICAL HISTORY :  He  has a past medical history of Diabetes mellitus; Hypertension; Hyperlipidemia; Cholelithiasis; CAD (coronary artery disease); Venous insufficiency of leg; BPH (benign prostatic hyperplasia); Eosinophilia (2005); Diverticulosis (2005); Insomnia; Chronic kidney disease; Hyperthyroidism; and Proteinuria (2007).  PAST SURGICAL HISTORY: He  has past surgical history that includes Coronary artery bypass graft.  No Known Allergies  No current facility-administered medications on file prior to encounter.   Current Outpatient Prescriptions on File Prior to Encounter  Medication Sig  . acetaminophen (TYLENOL) 325 MG tablet Take 650 mg by mouth as needed for pain.  Marland Kitchen amLODipine (NORVASC) 5 MG tablet Take 1 tablet (5 mg total) by mouth daily.  . benazepril (LOTENSIN) 20 MG tablet TAKE 1 TABLET BY MOUTH ONCE DAILY  . dipyridamole-aspirin (AGGRENOX) 200-25 MG per 12 hr capsule TAKE 1 CAPSULE BY MOUTH 2 (TWO) TIMES DAILY.  . Elastic Bandages & Supports (MEDICAL COMPRESSION SOCKS) MISC Apply to both legs up to your knees bilaterally every day.  . furosemide (LASIX) 40 MG tablet Take 2 tabs every day  . glipiZIDE (GLUCOTROL) 5 MG tablet  Take 0.5 tablets (2.5 mg total) by mouth daily before breakfast.  . metoprolol tartrate (LOPRESSOR) 25 MG tablet TAKE 1/2 TABLET (12.5 MG) TWICE DAILY  . nitroGLYCERIN (NITROSTAT) 0.4 MG SL tablet Place 0.4 mg under the tongue every 5 (five) minutes as needed. For chest pain  . omeprazole (PRILOSEC) 20 MG capsule TAKE 1 CAPSULE BY MOUTH EVERY OTHER DAY  . simvastatin (ZOCOR) 20 MG tablet Take 1 tablet (20 mg total) by mouth at bedtime. .  . tamsulosin (FLOMAX) 0.4 MG CAPS capsule Take 1 capsule (0.4 mg total) by mouth daily.  . Vitamin D, Ergocalciferol, (DRISDOL) 50000  UNITS CAPS capsule Take 1 capsule (50,000 Units total) by mouth every 7 (seven) days.    FAMILY HISTORY:  His has no family status information on file.   SOCIAL HISTORY: He  reports that he has quit smoking. He does not have any smokeless tobacco history on file. He reports that he does not drink alcohol.  REVIEW OF SYSTEMS:   Gen: Denies fever, chills, weight change, fatigue, night sweats.  Reports weakness / fatigue.   HEENT: Denies blurred vision, double vision, hearing loss, tinnitus, sinus congestion, rhinorrhea, sore throat, neck stiffness, dysphagia PULM: Denies shortness of breath, cough, sputum production, hemoptysis, wheezing CV: Denies edema, orthopnea, paroxysmal nocturnal dyspnea, palpitations.  Reports brief episode of chest pain that had resolved prior to presentation.   GI: Denies abdominal pain, nausea, vomiting, diarrhea,melena, constipation, change in bowel habits.  Pt reports bright red blood per rectum with bowel movements over the past 48-72 hours.   GU: Denies dysuria, hematuria, polyuria, oliguria, urethral discharge Endocrine: Denies hot or cold intolerance, polyuria, polyphagia or appetite change Derm: Denies rash, dry skin, scaling or peeling skin change Heme: Denies easy bruising, bleeding, bleeding gums Neuro: Denies headache, numbness, weakness, slurred speech, loss of memory or consciousness   SUBJECTIVE:    VITAL SIGNS: BP 128/80 mmHg  Pulse 75  Temp(Src) 98.5 F (36.9 C) (Oral)  Resp 21  SpO2 99%  HEMODYNAMICS:    VENTILATOR SETTINGS:    INTAKE / OUTPUT:    PHYSICAL EXAMINATION: General:  Elderly gentleman in NAD Neuro:  AAOx4, speech clear, MAE, generalized weakness  HEENT:  MM pink/moist, purple lesions under tongue (? Lingual varicosities), good dentition Cardiovascular:  s1s2 rrr, few PVC's noted on monitor  Lungs:  Even/non-labored, lungs bilaterally with faint basilar crackles  Abdomen:  Obese/soft, bsx4 active  Musculoskeletal:   No acute deformities  Skin:  Warm/dry, BLE 2-3+pitting edema, changes c/w chronic edema, sacral ulceration stage 1  LABS:  BMET  Recent Labs Lab 02/21/16 0945 02/21/16 0958  NA 141 142  K 4.5 4.5  CL 115* 113*  CO2 16*  --   BUN 55* 54*  CREATININE 4.22* 4.10*  GLUCOSE 183* 173*    Electrolytes  Recent Labs Lab 02/21/16 0945  CALCIUM 8.0*    CBC  Recent Labs Lab 02/21/16 0945 02/21/16 0958  WBC 9.4  --   HGB 6.8* 7.1*  HCT 21.5* 21.0*  PLT 178  --     Coag's  Recent Labs Lab 02/21/16 0945  INR 1.21    Sepsis Markers  Recent Labs Lab 02/21/16 1000  LATICACIDVEN 1.77    ABG No results for input(s): PHART, PCO2ART, PO2ART in the last 168 hours.  Liver Enzymes  Recent Labs Lab 02/21/16 0945  AST 70*  ALT 15*  ALKPHOS 54  BILITOT 0.4  ALBUMIN 2.5*    Cardiac Enzymes  Recent Labs Lab 02/21/16  0945  TROPONINI 32.50*    Glucose No results for input(s): GLUCAP in the last 168 hours.  Imaging Dg Chest 2 View  02/21/2016  CLINICAL DATA:  Possible myocardial infarction. EXAM: CHEST  2 VIEW COMPARISON:  Into 313. FINDINGS: Cardiomegaly. Status post CABG. Linear markings of the lung bases could represent early interstitial edema. No consolidation. No significant pleural effusion. No visible pneumothorax. IMPRESSION: Worsening aeration from priors. Cardiomegaly. Early interstitial edema is possible. Electronically Signed   By: Elsie Stain M.D.   On: 02/21/2016 10:05     STUDIES:  ECHO 3/26 >>   CULTURES: UA 3/26 >>   ANTIBIOTICS:   SIGNIFICANT EVENTS: 3/26  Admit with chest pain / NSTEMI, symptomatic anemia, BRBPR/GIB & AKI  LINES/TUBES:   DISCUSSION: 82 y/o M admitted with GIB, symptomatic anemia, AKI, NSTEMI / chest pain   ASSESSMENT / PLAN:  PULMONARY A: At Risk Respiratory Failure - in setting of NSTEMI P:   Oxygen as needed to support sats > 92% Pulmonary hygiene   CARDIOVASCULAR A:  NSTEMI - initial troponin  >35 Hx CAD s/p CABG, HTN, HLD P:  Admit to ICU for hemodynamic monitoring  DNR/DNI  Assess ECHO  Trend troponin  Cardiology consult, appreciate assistance   Continue home norvasc, metoprolol - to start in am 3/27 Hold home benazepril, aggrenox, zocor  RENAL A:   Acute on Chronic Kidney Injury - appears baseline ~ 2.5 Hx BPH  P:   Trend BMP / UOP  Hold home flomax, consider restart am 3/27  GASTROINTESTINAL A:   BRBPR - ? Diverticular bleed vs internal hemorrhoid  Hx Diverticulosis P:   GI consulted, appreciate input NPO x ice chips sparringly  Continue PPI  Monitor for further bleeding   HEMATOLOGIC A:   Anemia - blood loss + suspected element of chronic disease  P:  Trend CBC SCD's for DVT prophylaxis   INFECTIOUS A:   No indication of acute infectious process  P:   Monitor fever curve / WBC   ENDOCRINE A:   DM II    P:   Hold home glipizide  SSI Q4 while NPO   NEUROLOGIC A:   Pain - in setting of NSTEMI  P:   RASS goal: n/a  Supportive care   FAMILY  - Updates:  No family at bedside.  Patient updated on plan of care.  Will ask social work to see if there is anything we can do to check on his wife while he is inpatient.    - Inter-disciplinary family meet or Palliative Care meeting due by:   4/3    Canary Brim, NP-C [facility] Pgr: [phone] or if no answer [phone] 02/21/2016, 11:34 AM    Attending:  I have seen and examined the patient with Darlyn Read I agree with the findings from her note.  Mr. Arment presented to the emergency department today after having bright red blood per rectum approximately 2 days ago. His blood work indicates that he has had a myocardial infarction. He has a history of diverticulosis.  He denies abdominal pain  He has received blood transfusions here and he says he is feeling better.  On exam: Mildly pale, elderly male in no distress Cardiac exam within normal limits Lungs  with some Rales in the right base which is currently his dependent lung, no wheezing left lung is clear Belly is soft nontender with positive bowel sounds  Hemoglobin 6.8 Troponin 35 Creatinine 4  GI bleed:  Presumably diverticular, he does not appear to be actively bleeding at this time. Unfortunately our options for treating this are severely limited considering his active MI as well as his renal dysfunction. We will continue transfusions and close monitoring of his hemoglobin. Non-ST elevation MI secondary to severe anemia: Continue blood transfusion, hold on aspirin for now considering the bleeding episode Acute kidney injury: Again as above we will transfuse packed red blood cells and watch his respiratory status closely, hopefully he will return back to his baseline of 2.5 so  He is critically ill and requires monitoring in the intensive care unit  My critical care time 38 minutes  Heber [place], MD  PCCM Pager: [phone] Cell: [phone] After 3pm or if no response, call [phone]

## 2016-02-21 NOTE — ED Provider Notes (Signed)
CSN: [id number]     Arrival date & time 02/21/16  6962 History   First MD Initiated Contact with Patient 02/21/16 [phone]     No chief complaint on file.    (Consider location/radiation/quality/duration/timing/severity/associated sxs/prior Treatment) HPI 82 year old male with a past history of CAD with CABG, diabetes, and hypertension presents with chest pain. History taken from patient and EMS. Patient called out for chest pain that lasted about 10 minutes and is now gone. Reports it as a pressure. No shortness of breath. EMS noted ST depressions laterally. Took 2 baby ASA this morning, EMS gave him 2 more. Patient has been feeling diffusely weak for "a couple weeks". Patient notes that he had rectal bleeding 2 days ago one time. However he arrives in a white shirt that is covered in dark red material that is likely blood. Patient states he is wearing the same shirt from 2 days ago. Patient lives at home with wife. Per EMS, patient's house is clean and well appearing. Patient wears TED hose for chronic leg swelling that he states is not worse than normal.  Past Medical History  Diagnosis Date  . Diabetes mellitus   . Hypertension   . Hyperlipidemia   . Cholelithiasis     Dr. Matthias Hughs  . CAD (coronary artery disease)     s/p CABG in 1997. Dr. Juanda Chance.  . Venous insufficiency of leg   . BPH (benign prostatic hyperplasia)     Dr. Isabel Caprice  . Eosinophilia 2005    Since 2005  . Diverticulosis 2005    GI bleed 02/2004  . Insomnia   . Chronic kidney disease     Cr around 2 since 2005  . Hyperthyroidism     persistently low TSH. RAI treatement in 2002. Reffered to Dr. Ardyth Harps in 2007.  Marland Kitchen Proteinuria 2007   Past Surgical History  Procedure Laterality Date  . Coronary artery bypass graft      1997   No family history on file. Social History  Substance Use Topics  . Smoking status: Former Games developer  . Smokeless tobacco: Not on file  . Alcohol Use: Not on file    Review of Systems   Respiratory: Negative for shortness of breath.   Cardiovascular: Positive for chest pain and leg swelling.  Gastrointestinal: Negative for vomiting, abdominal pain and diarrhea.  Neurological: Positive for weakness.  All other systems reviewed and are negative.     Allergies  Review of patient's allergies indicates no known allergies.  Home Medications   Prior to Admission medications   Medication Sig Start Date End Date Taking? Authorizing Provider  acetaminophen (TYLENOL) 325 MG tablet Take 650 mg by mouth as needed for pain.    Historical Provider, MD  amLODipine (NORVASC) 5 MG tablet Take 1 tablet (5 mg total) by mouth daily. 06/12/15   Otis Brace, MD  benazepril (LOTENSIN) 20 MG tablet TAKE 1 TABLET BY MOUTH ONCE DAILY 06/12/15   Otis Brace, MD  dipyridamole-aspirin (AGGRENOX) 200-25 MG per 12 hr capsule TAKE 1 CAPSULE BY MOUTH 2 (TWO) TIMES DAILY. 07/20/15   Otis Brace, MD  Elastic Bandages & Supports (MEDICAL COMPRESSION SOCKS) MISC Apply to both legs up to your knees bilaterally every day. 06/16/14   Ky Barban, MD  furosemide (LASIX) 40 MG tablet Take 2 tabs every day 06/12/15   Otis Brace, MD  glipiZIDE (GLUCOTROL) 5 MG tablet Take 0.5 tablets (2.5 mg total) by mouth daily before breakfast. 06/12/15   Otis Brace, MD  metoprolol tartrate (LOPRESSOR) 25 MG tablet TAKE 1/2 TABLET (12.5 MG) TWICE DAILY 11/19/15   Otis Brace, MD  nitroGLYCERIN (NITROSTAT) 0.4 MG SL tablet Place 0.4 mg under the tongue every 5 (five) minutes as needed. For chest pain    Historical Provider, MD  omeprazole (PRILOSEC) 20 MG capsule TAKE 1 CAPSULE BY MOUTH EVERY OTHER DAY 07/20/15   Otis Brace, MD  simvastatin (ZOCOR) 20 MG tablet Take 1 tablet (20 mg total) by mouth at bedtime. . 06/12/15   Otis Brace, MD  tamsulosin (FLOMAX) 0.4 MG CAPS capsule Take 1 capsule (0.4 mg total) by mouth daily. 06/12/15   Otis Brace, MD  Vitamin D, Ergocalciferol, (DRISDOL) 50000 UNITS  CAPS capsule Take 1 capsule (50,000 Units total) by mouth every 7 (seven) days. 06/14/15   Marjan Rabbani, MD   BP 129/75 mmHg  Pulse 76  Temp(Src) 98.5 F (36.9 C) (Rectal)  Resp 22  SpO2 97% Physical Exam  Constitutional: He is oriented to person, place, and time. He appears well-developed and well-nourished. No distress.  White shirt patient arrives in is about half dark red  HENT:  Head: Normocephalic and atraumatic.  Right Ear: External ear normal.  Left Ear: External ear normal.  Nose: Nose normal.  Mouth/Throat: Mucous membranes are dry.  Purple spots that appear like petechiae under patient's tongue in floor of mouth  Eyes: Right eye exhibits no discharge. Left eye exhibits no discharge.  Neck: Neck supple.  Cardiovascular: Normal rate, regular rhythm, normal heart sounds and intact distal pulses.   Pulmonary/Chest: Effort normal. Tachypnea noted. No respiratory distress. He has decreased breath sounds in the right lower field and the left lower field. He has rales (mild) in the right lower field and the left lower field.  Abdominal: Soft. He exhibits no distension. There is no tenderness.  Genitourinary:  Superficial blisters on scrotum Brown stool on rectal thermometer probe  Musculoskeletal: He exhibits edema (bilateral, symmetric, lower leg/feet swelling - pitting).  Neurological: He is alert and oriented to person, place, and time.  Skin: Skin is warm and dry. He is not diaphoretic. There is pallor.  Stage 1 sacral decub Skin flaking/break down when TED hose removed by nurse  Nursing note and vitals reviewed.   ED Course  Procedures (including critical care time) Labs Review Labs Reviewed  COMPREHENSIVE METABOLIC PANEL - Abnormal; Notable for the following:    Chloride 115 (*)    CO2 16 (*)    Glucose, Bld 183 (*)    BUN 55 (*)    Creatinine, Ser 4.22 (*)    Calcium 8.0 (*)    Total Protein 5.1 (*)    Albumin 2.5 (*)    AST 70 (*)    ALT 15 (*)    GFR calc  non Af Amer 11 (*)    GFR calc Af Amer 13 (*)    All other components within normal limits  BRAIN NATRIURETIC PEPTIDE - Abnormal; Notable for the following:    B Natriuretic Peptide 1537.2 (*)    All other components within normal limits  TROPONIN I - Abnormal; Notable for the following:    Troponin I 32.50 (*)    All other components within normal limits  CBC WITH DIFFERENTIAL/PLATELET - Abnormal; Notable for the following:    RBC 2.25 (*)    Hemoglobin 6.8 (*)    HCT 21.5 (*)    All other components within normal limits  PROTIME-INR - Abnormal; Notable for the following:  Prothrombin Time 15.5 (*)    All other components within normal limits  POC OCCULT BLOOD, ED - Abnormal; Notable for the following:    Fecal Occult Bld POSITIVE (*)    All other components within normal limits  I-STAT TROPOININ, ED - Abnormal; Notable for the following:    Troponin i, poc >35.00 (*)    All other components within normal limits  I-STAT CHEM 8, ED - Abnormal; Notable for the following:    Chloride 113 (*)    BUN 54 (*)    Creatinine, Ser 4.10 (*)    Glucose, Bld 173 (*)    Calcium, Ion 1.09 (*)    Hemoglobin 7.1 (*)    HCT 21.0 (*)    All other components within normal limits  URINALYSIS, ROUTINE W REFLEX MICROSCOPIC (NOT AT [facility])  I-STAT CG4 LACTIC ACID, ED  TYPE AND SCREEN  PREPARE RBC (CROSSMATCH)  ABO/RH    Imaging Review Dg Chest 2 View  02/21/2016  CLINICAL DATA:  Possible myocardial infarction. EXAM: CHEST  2 VIEW COMPARISON:  Into 313. FINDINGS: Cardiomegaly. Status post CABG. Linear markings of the lung bases could represent early interstitial edema. No consolidation. No significant pleural effusion. No visible pneumothorax. IMPRESSION: Worsening aeration from priors. Cardiomegaly. Early interstitial edema is possible. Electronically Signed   By: Elsie Stain M.D.   On: 02/21/2016 10:05   I have personally reviewed and evaluated these images and lab results as part of my  medical decision-making.   EKG Interpretation   Date/Time:  Sunday February 21 2016 09:28:05 EDT Ventricular Rate:  76 PR Interval:  198 QRS Duration: 109 QT Interval:  401 QTC Calculation: 451 R Axis:   -28 Text Interpretation:  Sinus rhythm Multiple premature complexes, vent &  supraven Borderline left axis deviation Probable anterior infarct, age  indeterminate Lateral leads are also involved ST depressions new compared  to 2015 Confirmed by Lana Flaim  MD, Yousuf Ager ([id number]) on 02/21/2016 9:31:35 AM      CRITICAL CARE Performed by: Pricilla Loveless T   Total critical care time: 40 minutes  Critical care time was exclusive of separately billable procedures and treating other patients.  Critical care was necessary to treat or prevent imminent or life-threatening deterioration.  Critical care was time spent personally by me on the following activities: development of treatment plan with patient and/or surrogate as well as nursing, discussions with consultants, evaluation of patient's response to treatment, examination of patient, obtaining history from patient or surrogate, ordering and performing treatments and interventions, ordering and review of laboratory studies, ordering and review of radiographic studies, pulse oximetry and re-evaluation of patient's condition.  MDM   Final diagnoses:  NSTEMI (non-ST elevated myocardial infarction) (HCC)  Symptomatic anemia  Acute kidney injury (HCC)  Acute GI bleeding    Patient presents after brief chest pain. He is hemodynamically stable but looks pale and acute on chronically ill-appearing. Patient found to have an acute GI bleed although most of his bleeding likely occurred 2 days ago. His troponin is severely elevated at 32. He has diffuse EKG changes with ST depression. Likely this is related to poor oxygen delivery to his heart as opposed to an acute blockage. With his acute GI bleed I do not feel heparin would be indicated. He is not  actively having chest pain. His hemoglobin is 6.8 and with an active bleed with Hemoccult positive stool he will be transfused 2 units. I've consulted ICU Will admit overnight and monitor closely. I've also discussed  with cardiology who will come consult. Gastroenterology, Dr. Bosie ClosSchooler, will also evaluate. Patient has remained hemodynamically stable but is critically ill at this time.    Pricilla LovelessScott Keiara Sneeringer, MD 02/21/16 [phone]

## 2016-02-21 NOTE — Consult Note (Addendum)
CARDIOLOGY CONSULT NOTE   Patient ID: Jeremy Sanders MRN: [id number] DOB/AGE: 03/30/1925 82 y.o.  Admit date: 02/21/2016  Primary Physician   Otis BraceABBANI, MARJAN, MD Primary Cardiologist   Dr. Verne Carrowhristopher McAlhany  Reason for Consultation   Elevated troponin Requesting Physician  Dr. Criss AlvineGoldston  HPI: Jeremy LingoHelio Tatem is a 82 y.o. male with a history of CAD s/p CABG in 1997, HTN, HL, diabetes, CKD, prior CVA, hyperthyroidism s/p RAI treatment, venous insufficiency leading to chronic leg edema, GI bleed 2005 due to diverticulosis, former smoker and palpitation (resolved after he stopped drinking coffee). LHC in 2004 demonstrated patent grafts and severe dist LAD disease beyond graft insertion. Med Rx was continued. Last seen by Dr. Verne Carrowhristopher McAlhany in 02/2013. Last seen in clinic 03/03/14 by Tereso NewcomerScott Weaver 03/03/14. At that time he was doing well on cardiac stand point and advised to f/u in 3 months however never did for unknown reason. He has hx of PAVs and PVAs.   Presented 02/21/16 by EMS. Patient had a brief episode of chest pain this morning and EMS was called. Upon arrival of EMS his chest pain was resolved. However told me that he never had any chest pain. Took 2 baby ASA this morning, EMS gave him 2 more. Patient had a large blood from his rectum on Friday and since then he is very weak. Denies shortness of breath. Admits to having intermittent dizziness. He has a chronic lower extremity edema per patient and use socks. He tends denies orthopnea, PND, syncope. Also admits to having difficulty swallowing. He  lives at home with his wife.  EKG shows ST depression in lateral lead > inferior lead,  Q waves in anterior lead, PACs. Lateral  lead EKG appears more prominent than prior EKG. Point-of-care troponin of greater than 35. Troponin I of 32.5. BNP of 1537. Hemoglobin of 6.8 -->7.1. Creatinine of 4.22 --> 4.1. Minimally elevated LFTs. Positive stool guaiac. His x-ray shows cardiomegaly with possible  interstitial edema.   Past Medical History  Diagnosis Date  . Diabetes mellitus   . Hypertension   . Hyperlipidemia   . Cholelithiasis     Dr. Matthias HughsBuccini  . CAD (coronary artery disease)     s/p CABG in 1997. Dr. Juanda ChanceBrodie.  . Venous insufficiency of leg   . BPH (benign prostatic hyperplasia)     Dr. Isabel CapriceGrapey  . Eosinophilia 2005    Since 2005  . Diverticulosis 2005    GI bleed 02/2004  . Insomnia   . Chronic kidney disease     Cr around 2 since 2005  . Hyperthyroidism     persistently low TSH. RAI treatement in 2002. Reffered to Dr. Ardyth HarpsSteve South in 2007.  Marland Kitchen. Proteinuria 2007     Past Surgical History  Procedure Laterality Date  . Coronary artery bypass graft      1997    No Known Allergies  I have reviewed the patient's current medications   . sodium chloride       Prior to Admission medications   Medication Sig Start Date End Date Taking? Authorizing Provider  acetaminophen (TYLENOL) 325 MG tablet Take 650 mg by mouth as needed for pain.    Historical Provider, MD  amLODipine (NORVASC) 5 MG tablet Take 1 tablet (5 mg total) by mouth daily. 06/12/15   Otis BraceMarjan Rabbani, MD  benazepril (LOTENSIN) 20 MG tablet TAKE 1 TABLET BY MOUTH ONCE DAILY 06/12/15   Otis BraceMarjan Rabbani, MD  dipyridamole-aspirin (AGGRENOX) 200-25 MG per 12 hr capsule TAKE 1  CAPSULE BY MOUTH 2 (TWO) TIMES DAILY. 07/20/15   Otis Brace, MD  Elastic Bandages & Supports (MEDICAL COMPRESSION SOCKS) MISC Apply to both legs up to your knees bilaterally every day. 06/16/14   Ky Barban, MD  furosemide (LASIX) 40 MG tablet Take 2 tabs every day 06/12/15   Otis Brace, MD  glipiZIDE (GLUCOTROL) 5 MG tablet Take 0.5 tablets (2.5 mg total) by mouth daily before breakfast. 06/12/15   Otis Brace, MD  metoprolol tartrate (LOPRESSOR) 25 MG tablet TAKE 1/2 TABLET (12.5 MG) TWICE DAILY 11/19/15   Otis Brace, MD  nitroGLYCERIN (NITROSTAT) 0.4 MG SL tablet Place 0.4 mg under the tongue every 5 (five) minutes as  needed. For chest pain    Historical Provider, MD  omeprazole (PRILOSEC) 20 MG capsule TAKE 1 CAPSULE BY MOUTH EVERY OTHER DAY 07/20/15   Otis Brace, MD  simvastatin (ZOCOR) 20 MG tablet Take 1 tablet (20 mg total) by mouth at bedtime. . 06/12/15   Otis Brace, MD  tamsulosin (FLOMAX) 0.4 MG CAPS capsule Take 1 capsule (0.4 mg total) by mouth daily. 06/12/15   Otis Brace, MD  Vitamin D, Ergocalciferol, (DRISDOL) 50000 UNITS CAPS capsule Take 1 capsule (50,000 Units total) by mouth every 7 (seven) days. 06/14/15   Otis Brace, MD     Social History   Social History  . Marital Status: Married    Spouse Name: N/A  . Number of Children: N/A  . Years of Education: N/A   Occupational History  . Not on file.   Social History Main Topics  . Smoking status: Former Games developer  . Smokeless tobacco: Not on file  . Alcohol Use: No  . Drug Use: Not on file  . Sexual Activity: Not on file   Other Topics Concern  . Not on file   Social History Narrative   Originally from [place]. Has been a world traveler.    Unclear about his career by appears its a comfortable retirement with his wife in [place].     Family History: Denied family hx of cardiac disease.   ROS:  Full 14 point review of systems complete and found to be negative unless listed above.  Physical Exam: Blood pressure 127/80, pulse 72, temperature 98.5 F (36.9 C), temperature source Rectal, resp. rate 20, SpO2 98 %.  General: Well developed, well nourished, male in no acute distress Head: Eyes PERRLA, No xanthomas. Normocephalic and atraumatic, oropharynx without edema or exudate.  Lungs: Resp regular and unlabored, bibasilar rales.  Heart: RRR no s3, s4, or murmurs..   Neck: No carotid bruits. No lymphadenopathy.  JVD unable to assess due to bread.  Abdomen: Bowel sounds present, abdomen soft and non-tender without masses or hernias noted. Msk:  No spine or cva tenderness. No weakness, no joint deformities or  effusions. Extremities: No clubbing, cyanosis. 3-4 + BL LE edema. DP/PT/Radials 2+ and equal bilaterally. Neuro: Alert and oriented X 3. No focal deficits noted. Psych:  Good affect, responds appropriately Skin: No rashes or lesions noted.  Labs:   Lab Results  Component Value Date   WBC 9.4 02/21/2016   HGB 7.1* 02/21/2016   HCT 21.0* 02/21/2016   MCV 95.6 02/21/2016   PLT 178 02/21/2016    Recent Labs  02/21/16 0945  INR 1.21    Recent Labs Lab 02/21/16 0945 02/21/16 0958  NA 141 142  K 4.5 4.5  CL 115* 113*  CO2 16*  --   BUN 55* 54*  CREATININE 4.22* 4.10*  CALCIUM 8.0*  --   PROT 5.1*  --   BILITOT 0.4  --   ALKPHOS 54  --   ALT 15*  --   AST 70*  --   GLUCOSE 183* 173*  ALBUMIN 2.5*  --    No results found for: MG  Recent Labs  02/21/16 0945  TROPONINI 32.50*    Recent Labs  02/21/16 0949  TROPIPOC >35.00*   No results found for: PROBNP Lab Results  Component Value Date   CHOL 130 06/12/2015   HDL 71 06/12/2015   LDLCALC 46 06/12/2015   TRIG 64 06/12/2015    Studies: - LHC (10/03/03): S-OM/PL branch (also fills RI branch which was not grafted), L-LAD ok with dist LAD 90 and 90. Med Rx. - Echo (01/02/12): Mild LVH, EF 50% - Carotid US (01/02/12): No ICA stenosis   Echo: 01/02/2012 LV EF: 50%  ------------------------------------------------------------ Indications:   CVA 436.  ------------------------------------------------------------ History:  PMH:  Coronary artery disease. Risk factors: Hypertension.  ------------------------------------------------------------ Study Conclusions  - Left ventricle: The cavity size was normal. There was mild concentric hypertrophy. The estimated ejection fraction was 50%. - Atrial septum: No defect or patent foramen ovale was identified.  ECG:  EKG shows ST depression in lateral lead > inferior lead,  Q waves in anterior lead, PACs. Vent. rate 76 BPM PR interval 198 ms QRS  duration 109 ms QT/QTc 401/451 ms   Radiology:  Dg Chest 2 View  02/21/2016  CLINICAL DATA:  Possible myocardial infarction. EXAM: CHEST  2 VIEW COMPARISON:  Into 313. FINDINGS: Cardiomegaly. Status post CABG. Linear markings of the lung bases could represent early interstitial edema. No consolidation. No significant pleural effusion. No visible pneumothorax. IMPRESSION: Worsening aeration from priors. Cardiomegaly. Early interstitial edema is possible. Electronically Signed   By: Elsie Stain M.D.   On: 02/21/2016 10:05    ASSESSMENT AND PLAN:     Jeremy Sanders is a 82 y.o. male with a history of CAD s/p CABG in 1997, HTN, HL, diabetes, CKD, prior CVA, hyperthyroidism s/p RAI treatment, venous insufficiency leading to chronic leg edema, GI bleed 2005 due to diverticulosis, former smoker and palpitation (resolved after he stopped drinking coffee) who presented 02/21/16  by EMS due to chest pain, weakness and acute blood loss.  1. NSTEMI - Point-of-care troponin of greater than 35. Troponin I of 32.5. EKG shows ST depression in lateral lead > inferior lead,  Q waves in anterior lead, PACs. Lateral  lead EKG appears more prominent than prior EKG. - No a candidate to start IV heparin due to an acute GI loss. Will do ischemic evaluation once stable. Continue cycle troponin. Will get echocardiogram.  2. Volume overload - Has a history of venous insufficiency and chronic leg edema. Significant volume overload on exam With BNP of  BNP of 1537.  - Will discuss diuretics dose with M.D.  3. CAD s/p CABG in 1997 - LHC in 2004 demonstrated patent grafts and severe dist LAD disease beyond graft insertion. Med Rx was continued.  4. Acute blood loss anemia - Recently undergoing blood transfusion, 2 packs of unit.   SignedManson Passey, PA 02/21/2016, 11:15 AM Pager [phone]  Co-Sign MD  The patient was seen and examined, and I agree with the assessment and plan as documented above. Pt with  aforementioned h/o CABG admitted with acute GI bleed (Hgb 6.8) and concomitant NSTEMI (troponins 32.5). Had chest pain two days ago for "first time in years" and then again this  morning, but has subsequently resolved. Currently denies shortness of breath. Receiving 2 units PRBC transfusion during time of my exam. ECG shows inferolateral ST depressions and T wave inversions with minimal aVR elevation. Unable to heparinize given acute GI bleed. Already received 4 baby ASA. Will obtain echocardiogram to assess LVEF and regional wall motion. However, given multiple comorbidites including acute on chronic renal insufficiency, poor coronary angiography candidate. Will manage medically. Agree with starting beta blockers and restarting amlodipine once he is deemed hemodynamically stable and Hgb is stable. Once he has finished receiving PRBC transfusion, would start with Lasix 40 mg po daily, with close monitoring of renal function. Liver transaminases mildly elevated. Can hold statin therapy for now.  Prentice Docker, MD, [facility]  02/21/2016 1:31 PM

## 2016-02-21 NOTE — ED Notes (Signed)
Pt real age is [age]. Pt states his father forged his DOB dt the WW2 and he didn't want him to be drafted.

## 2016-02-21 NOTE — Progress Notes (Signed)
eLink Physician-Brief Progress Note Patient Name: Jeremy LingoHelio Sanders DOB: 08/25/1925 MRN: [id number]   Date of Service  02/21/2016  HPI/Events of Note  bp running high now/ CRI on acei    eICU Interventions  rx clonidine po 0.1 mg tid/ no acei      Intervention Category Intermediate Interventions: Hypertension - evaluation and management  Sandrea HughsMichael Norman Bier 02/21/2016, 7:08 PM

## 2016-02-21 NOTE — ED Notes (Signed)
Attempted report to 2S for the second time.

## 2016-02-21 NOTE — ED Notes (Signed)
Pt arrives from home via [facility]. Pt called out for CP that started this morning and had resolved upon EMS arrival. Pt states he started having bleeding from his rectum on Friday and has noticed he is pale and very weak. Pt was soiled in frank red blood up to his scapula upon arrival. Decub noted to sacrum, possibly stage 2. Pt has blisters on scrotum. Pt has bilateral lower leg edema with +4 pitting edema. Pt had ted hose on upon arrival which were taken off by this RN rt swelling/blisters coming from between little holes in hose. Large amounts of skin and some breakdown noted. Pt denies CP currently and only complains of weakness. Occult blood was positive and Dr. Criss AlvineGoldston notified.

## 2016-02-22 ENCOUNTER — Inpatient Hospital Stay (HOSPITAL_COMMUNITY): Payer: Medicare Other

## 2016-02-22 DIAGNOSIS — R079 Chest pain, unspecified: Secondary | ICD-10-CM

## 2016-02-22 DIAGNOSIS — L8990 Pressure ulcer of unspecified site, unspecified stage: Secondary | ICD-10-CM | POA: Diagnosis present

## 2016-02-22 DIAGNOSIS — D649 Anemia, unspecified: Secondary | ICD-10-CM | POA: Diagnosis present

## 2016-02-22 DIAGNOSIS — I214 Non-ST elevation (NSTEMI) myocardial infarction: Secondary | ICD-10-CM | POA: Diagnosis present

## 2016-02-22 DIAGNOSIS — K922 Gastrointestinal hemorrhage, unspecified: Secondary | ICD-10-CM | POA: Diagnosis present

## 2016-02-22 DIAGNOSIS — N179 Acute kidney failure, unspecified: Secondary | ICD-10-CM | POA: Diagnosis present

## 2016-02-22 LAB — TROPONIN I
Troponin I: 75.41 ng/mL (ref <0.031)
Troponin I: 51.47 ng/mL (ref <0.031)

## 2016-02-22 LAB — BASIC METABOLIC PANEL
Anion gap: 10 (ref 5–15)
BUN: 51 mg/dL — AB (ref 6–20)
CHLORIDE: 116 mmol/L — AB (ref 101–111)
CO2: 16 mmol/L — AB (ref 22–32)
CREATININE: 4.07 mg/dL — AB (ref 0.61–1.24)
Calcium: 8.1 mg/dL — ABNORMAL LOW (ref 8.9–10.3)
GFR calc Af Amer: 14 mL/min — ABNORMAL LOW (ref >60)
GFR calc non Af Amer: 12 mL/min — ABNORMAL LOW (ref >60)
Glucose, Bld: 145 mg/dL — ABNORMAL HIGH (ref 65–99)
Potassium: 4.7 mmol/L (ref 3.5–5.1)
Sodium: 142 mmol/L (ref 135–145)

## 2016-02-22 LAB — TYPE AND SCREEN
ABO/RH(D): O POS
Antibody Screen: NEGATIVE
Unit division: 0
Unit division: 0

## 2016-02-22 LAB — GLUCOSE, CAPILLARY
GLUCOSE-CAPILLARY: 147 mg/dL — AB (ref 65–99)
GLUCOSE-CAPILLARY: 89 mg/dL (ref 65–99)
GLUCOSE-CAPILLARY: 168 mg/dL — AB (ref 65–99)
GLUCOSE-CAPILLARY: 186 mg/dL — AB (ref 65–99)
Glucose-Capillary: 104 mg/dL — ABNORMAL HIGH (ref 65–99)
Glucose-Capillary: 121 mg/dL — ABNORMAL HIGH (ref 65–99)
Glucose-Capillary: 161 mg/dL — ABNORMAL HIGH (ref 65–99)

## 2016-02-22 LAB — CBC
HEMATOCRIT: 26.8 % — AB (ref 39.0–52.0)
HEMATOCRIT: 25.3 % — AB (ref 39.0–52.0)
HEMOGLOBIN: 8.7 g/dL — AB (ref 13.0–17.0)
Hemoglobin: 8.2 g/dL — ABNORMAL LOW (ref 13.0–17.0)
MCH: 30.3 pg (ref 26.0–34.0)
MCH: 30.5 pg (ref 26.0–34.0)
MCHC: 32.5 g/dL (ref 30.0–36.0)
MCHC: 32.4 g/dL (ref 30.0–36.0)
MCV: 93.4 fL (ref 78.0–100.0)
MCV: 94.1 fL (ref 78.0–100.0)
PLATELETS: 146 10*3/uL — AB (ref 150–400)
Platelets: 172 10*3/uL (ref 150–400)
RBC: 2.87 MIL/uL — ABNORMAL LOW (ref 4.22–5.81)
RBC: 2.69 MIL/uL — ABNORMAL LOW (ref 4.22–5.81)
RDW: 15.7 % — ABNORMAL HIGH (ref 11.5–15.5)
RDW: 16 % — AB (ref 11.5–15.5)
WBC: 12.3 10*3/uL — ABNORMAL HIGH (ref 4.0–10.5)
WBC: 11.1 10*3/uL — AB (ref 4.0–10.5)

## 2016-02-22 LAB — ECHOCARDIOGRAM COMPLETE
Height: 72 in
Weight: 3393.32 oz

## 2016-02-22 LAB — MAGNESIUM: Magnesium: 1.9 mg/dL (ref 1.7–2.4)

## 2016-02-22 LAB — PHOSPHORUS: Phosphorus: 5 mg/dL — ABNORMAL HIGH (ref 2.5–4.6)

## 2016-02-22 MED ORDER — TAMSULOSIN HCL 0.4 MG PO CAPS
0.4000 mg | ORAL_CAPSULE | Freq: Every day | ORAL | Status: DC
  Administered 2016-02-22 – 2016-02-24 (×3): 0.4 mg via ORAL
  Filled 2016-02-22 (×3): qty 1

## 2016-02-22 MED ORDER — SIMVASTATIN 20 MG PO TABS
20.0000 mg | ORAL_TABLET | Freq: Every day | ORAL | Status: DC
  Administered 2016-02-22 – 2016-02-23 (×2): 20 mg via ORAL
  Filled 2016-02-22 (×2): qty 1

## 2016-02-22 NOTE — Progress Notes (Signed)
CSW informed of pt transfer and concerns with pt wife's safety at home.  Per RN pt and wife have no family support in the area and wife is at home alone (reports wife is 80yo)  Pt is very concerned about his wife and would like help checking on her. Pt requested that CSW go to [employer] and buy his wife 10 chicken sandwiches so she would have enough to eat.  Pt reports that pt uses a walker at home  [facility]- they will assess if further action needs to take place  CSW will continue to follow if further needs arise  Merlyn LotJenna Holoman, [facility] Clinical Social Worker [phone]

## 2016-02-22 NOTE — Care Management Note (Signed)
Case Management Note  Patient Details  Name: Jeremy Sanders MRN: [id number] Date of Birth: 1925-06-21  Subjective/Objective:     Pt lives with 80 year-old wife, states he is the one who takes care of her as they have no relatives in this country.  Is very concerned about her well being and asks if someone could check on her.  CM discussed possibility of APS referral with CSW who will follow up.                            Expected Discharge Plan:  Home/Self Care  In-House Referral:  Clinical Social Work  Discharge planning Services  CM Consult  Status of Service:  In process, will continue to follow  Magdalene RiverMayo, Avrianna Smart T, RN 02/22/2016, 1:52 PM

## 2016-02-22 NOTE — Progress Notes (Signed)
Report given to Dois DavenportSandra RN at this time.

## 2016-02-22 NOTE — Progress Notes (Signed)
*  PRELIMINARY RESULTS* Echocardiogram 2D Echocardiogram has been performed.  Jeryl Columbialliott, Asha Grumbine 02/22/2016, 2:02 PM

## 2016-02-22 NOTE — Progress Notes (Addendum)
PULMONARY / CRITICAL CARE MEDICINE   Name: Jeremy LingoHelio Scrivener MRN: [id number] DOB: 1925-03-08    ADMISSION DATE:  02/21/2016 CONSULTATION DATE:  02/21/16  REFERRING MD:  Dr. Criss AlvineGoldston   CHIEF COMPLAINT:  GIB, NSTEMI, AKI, Symptomatic Anemia   HISTORY OF PRESENT ILLNESS:  82 y/o M, never smoker / ETOH, with PMH of DM, HTN, HLD, CAD s/p CABG, Venous Insufficiency, BPH, insomnia, diverticulosis, CKD (sr cr ~ 2.5 baseline), & hyperthyroidism who [facility] via EMS with complaints of weakness, chest pain and bright red blood per rectum.    At baseline, the patient lives with his 80 y/o wife alone.  They do not have family support.  He walks with a walker at home.  The patient is technically 82 years old - his father listed him as 4 years younger on documented paperwork to avoid being involved in WWII.  [place] and [place] 5 languages.  He typically helps take care of his wife and is concerned about her being alone without him.    The patient reports he has had weakness for two weeks.  2-3 days prior to admit, he began having bright red blood with bowel movements. He also noted chest pressure that was worse with lying down.  He reported it began on 3/26 and by the time EMS arrived, the chest pain had resolved.  EKG notable for new ST depression per EMS.  He was treated with baby ASA x4.  He arrived to the ER in a shirt that was covered in old blood (reportedly, he had been wearing for two days).  He typically wears TED hose for edema and notes that it is no worse than usual.  TED hose removed in ER due to swelling and concern for skin breakdown.    Initial ER evaluation notable for mild work of breathing per notes, LE swelling and stage 1 sacral decubitus.  No further bleeding noted in ER.  EDP reports rectal exam was brown stool but heme positive.  No bleeding noted on rectal exam.  Labs - Na 142, K 4.5, sr cr 4.10 (up from approx baseline of 2.5), AG 10, lactic acid 1.77, BNP  1537, troponin >35, WBC 9.4, Hgb 6.8 and platelets 178.  Initial CXR with mild interstitial edema, no effusions or overt CHF.    PCCM consulted for evaluation / admission.   SUBJECTIVE: Patient denies any nausea or emesis. No abdominal pain. Had BM this AM but no bright red blood. Denies any chest pain or pressure.  REVIEW OF SYSTEMS:  No subjective fever or chills. No dyspnea or cough.  VITAL SIGNS: BP 122/70 mmHg  Pulse 73  Temp(Src) 97.7 F (36.5 C) (Oral)  Resp 22  SpO2 93%  HEMODYNAMICS:    VENTILATOR SETTINGS:    INTAKE / OUTPUT: I/O last 3 completed shifts: In: 1055 [Blood:1055] Out: 1600 [Urine:1600]  PHYSICAL EXAMINATION: General:  No distress. Awake. Alert. Neuro:  Moving all 4 extremities. Oriented x3.  HEENT:  Moist mucus membranes. No scleral icterus or injection. Cardiovascular:  Regular rate. No appreciable JVD. Normal S1 & S2. Lungs:  Normal work of breathing on nasal cannula oxygen. Clear bilaterally to auscultation.  Abdomen:  Soft. Nontender. Nondistended.  Skin:  Warm & dry. No rash on exposed skin.  LABS:  BMET  Recent Labs Lab 02/21/16 0945 02/21/16 0958 02/21/16 2049 02/22/16 0206  NA 141 142 141 142  K 4.5 4.5 4.3 4.7  CL 115* 113* 115* 116*  CO2 16*  --  14* 16*  BUN 55* 54* 54* 51*  CREATININE 4.22* 4.10* 4.11* 4.07*  GLUCOSE 183* 173* 156* 145*    Electrolytes  Recent Labs Lab 02/21/16 0945 02/21/16 2049 02/22/16 0206  CALCIUM 8.0* 8.2* 8.1*  MG  --   --  1.9  PHOS  --   --  5.0*    CBC  Recent Labs Lab 02/21/16 0945 02/21/16 0958 02/21/16 2049 02/22/16 0206  WBC 9.4  --  12.1* 12.3*  HGB 6.8* 7.1* 9.0* 8.7*  HCT 21.5* 21.0* 28.4* 26.8*  PLT 178  --  170 172    Coag's  Recent Labs Lab 02/21/16 0945  INR 1.21    Sepsis Markers  Recent Labs Lab 02/21/16 1000  LATICACIDVEN 1.77    ABG No results for input(s): PHART, PCO2ART, PO2ART in the last 168 hours.  Liver Enzymes  Recent Labs Lab  02/21/16 0945 02/21/16 2049  AST 70* 90*  ALT 15* 18  ALKPHOS 54 60  BILITOT 0.4 0.7  ALBUMIN 2.5* 2.7*    Cardiac Enzymes  Recent Labs Lab 02/21/16 0945 02/21/16 2049 02/22/16 0206  TROPONINI 32.50* 48.04* 75.41*    Glucose  Recent Labs Lab 02/21/16 1304 02/21/16 1541 02/21/16 1924 02/22/16 0034 02/22/16 0330 02/22/16 0743  GLUCAP 149* 128* 126* 121* 147* 89    Imaging Dg Chest 2 View  02/21/2016  CLINICAL DATA:  Possible myocardial infarction. EXAM: CHEST  2 VIEW COMPARISON:  Into 313. FINDINGS: Cardiomegaly. Status post CABG. Linear markings of the lung bases could represent early interstitial edema. No consolidation. No significant pleural effusion. No visible pneumothorax. IMPRESSION: Worsening aeration from priors. Cardiomegaly. Early interstitial edema is possible. Electronically Signed   By: Elsie Stain M.D.   On: 02/21/2016 10:05   Dg Chest Port 1 View  02/22/2016  CLINICAL DATA:  Gastrointestinal bleeding, myocardial infarction, diabetes EXAM: PORTABLE CHEST 1 VIEW COMPARISON:  PA and lateral chest x-ray of February 21, 2016 FINDINGS: The lungs are well-expanded. The interstitial markings remain increased bilaterally. The cardiac silhouette remains enlarged. The pulmonary vascularity is mildly engorged but less prominent than on yesterday's study. There are post CABG changes. There is no significant pleural effusion and there is no pneumothorax. The trachea is midline. The bony thorax exhibits no acute abnormality. IMPRESSION: CHF superimposed upon COPD. There has been slight interval improvement since yesterday's study. Electronically Signed   By: David  [place] M.D.   On: 02/22/2016 07:26     STUDIES:  Port CXR 3/27:  Personally reviewed by me. Mild clearing right lower lung opacity. Bilateral hilar fullness persists. New blunting of left costocardiac angle.   MICROBIOLOGY: MRSA PCR 3/26:  Negative   ANTIBIOTICS: None.  SIGNIFICANT EVENTS: 3/26  Admit  with chest pain / NSTEMI, symptomatic anemia, BRBPR/GIB & AKI  LINES/TUBES: Foley 3/26>>> PIV x2  ASSESSMENT / PLAN:  82 y/o M admitted with GIB, symptomatic anemia, AKI, NSTEMI / chest pain. Bleeding was likely diverticular and appears to have stopped. GI & Cardiology are following. Patient's vitals have remained stable. No plan for an endovascular or endoscopic procedure at this time. Patient has stabilized enough to transfer to a telemetry bed.   1. Acute GIB:  Likely diverticular/lower bleed w/ known h/o diverticulosis. Hgb stable. Trending Hgb q12hr. GI consulted & holding off on endoscopy at this time. 2. NSTEMI:  Cardiology consulted. Plan to continue medical management. Continuing Lopressor & Zocor. TTE pending.  3. Acute on Chronic Renal Failure:  Baseline Creatinine  2.5. Holding Benazepril & Lasix. Trending renal function & electrolytes daily. 4. H/O CAD S/P CABG:  Holding Aggrenox & Bandzepril. Continuing Zocor.  5. H/O DM:  Holding Glipizide. Continuing accu-checks q4hr & SSI per algorithm. 6. H/O BPH:  Continuing Flomax. Foley in place for now. 7. H/O HTN:  Vitals per unit protocol. Continuing Clonidine 0.1mg  tid, Lopressor, & Norvasc. 8. Prophylaxis:  Protonix IV qhs & SCDs. 9. Diet:  Starting Clear Liquid Diet. 10. Disposition:  Plan to transfer patient to medical bed with telemetry monitoring. IMTS will assume care & PCCM will sign off as of 3/28.  Donna Christen Jamison Neighbor, M.D. [facility] Pager:  [phone] After 3pm or if no response, call [phone] 9:24 AM 02/22/2016

## 2016-02-22 NOTE — Progress Notes (Signed)
     SUBJECTIVE: No pain  BP 122/70 mmHg  Pulse 73  Temp(Src) 97.7 F (36.5 C) (Oral)  Resp 22  SpO2 93%  Intake/Output Summary (Last 24 hours) at 02/22/16 0835 Last data filed at 02/22/16 0600  Gross per 24 hour  Intake   1055 ml  Output   1600 ml  Net   -545 ml    PHYSICAL EXAM General: Well developed, well nourished, in no acute distress. Alert and oriented x 3.  Psych:  Good affect, responds appropriately Neck: No JVD. No masses noted.  Lungs: Clear bilaterally with no wheezes or rhonci noted.  Heart: RRR with no murmurs noted. Abdomen: Bowel sounds are present. Soft, non-tender.  Extremities: 1-2+ bilateral lower extremity edema.   LABS: Basic Metabolic Panel:  Recent Labs  16/08/9602/26/17 2049 02/22/16 0206  NA 141 142  K 4.3 4.7  CL 115* 116*  CO2 14* 16*  GLUCOSE 156* 145*  BUN 54* 51*  CREATININE 4.11* 4.07*  CALCIUM 8.2* 8.1*  MG  --  1.9  PHOS  --  5.0*   CBC:  Recent Labs  02/21/16 0945  02/21/16 2049 02/22/16 0206  WBC 9.4  --  12.1* 12.3*  NEUTROABS 6.8  --   --   --   HGB 6.8*  < > 9.0* 8.7*  HCT 21.5*  < > 28.4* 26.8*  MCV 95.6  --  93.7 93.4  PLT 178  --  170 172  < > = values in this interval not displayed. Cardiac Enzymes:  Recent Labs  02/21/16 0945 02/21/16 2049 02/22/16 0206  TROPONINI 32.50* 48.04* 75.41*   Current Meds: . amLODipine  5 mg Oral Daily  . antiseptic oral rinse  7 mL Mouth Rinse BID  . cloNIDine  0.1 mg Oral TID  . insulin aspart  0-15 Units Subcutaneous 6 times per day  . metoprolol tartrate  12.5 mg Oral BID  . pantoprazole (PROTONIX) IV  40 mg Intravenous QHS     ASSESSMENT AND PLAN:  1. CAD/NSTEMI: Pt has known CAD with prior CABG. No recent ischemic evaluation. Profound anemia on admission from GI bleeding. Will manage medically now as he is a poor candidate for invasive cardiac evaluation with anemia, GI bleeding and renal insufficiency. We cannot anti-coagulate at this time with GI bleeding.  Continue beta blocker.   2. Anemia: He is being transfused. He has been seen by GI and felt to be a poor candidate for endoscopy.   Sanders,Jeremy  3/27/20178:35 AM

## 2016-02-22 NOTE — Clinical Documentation Improvement (Signed)
Cardiology Critical Care  Can the diagnosis of CKD be further specified? Please document findings in next progress note. Thank you!   CKD Stage I - GFR greater than or equal to 90  CKD Stage II - GFR 60-89  CKD Stage III - GFR 30-59  CKD Stage IV - GFR 15-29  CKD Stage V - GFR < 15  ESRD (End Stage Renal Disease)  Other condition  Unable to clinically determine  Supporting Information: : (risk factors, signs and symptoms, diagnostics, treatment)  GFR's running 11-12 for current admission  Please exercise your independent, professional judgment when responding. A specific answer is not anticipated or expected.  Thank You, Shellee MiloEileen T Adaleena Mooers RN, BSN, CCDS Health Information Management  [phone]; Cell: [phone]

## 2016-02-22 NOTE — Progress Notes (Signed)
Pt transferred to rm 2w35 at this time.  No s/s of any acute distress noted.  VSS.  No c/o pain

## 2016-02-22 NOTE — Clinical Documentation Improvement (Signed)
Cardiology Critical Care  Cardiology Consult notes significant Volume Overload on exam with BNPof 1537. Please document any associated diagnoses/conditions including type and acuity the patient has or may have in next progress note.   Other  Clinically Undetermined  Supporting Information:  "Will discuss diuretics dose with M.D"  Please exercise your independent, professional judgment when responding. A specific answer is not anticipated or expected.  Thank You, Shellee MiloEileen T Keiry Kowal RN, BSN, CCDS Health Information Management [place] [phone]; Cell: [phone]

## 2016-02-22 NOTE — Progress Notes (Signed)
Patient ID: Jeremy LingoHelio Prude, male   DOB: [birth year], 82 y.o.   MRN: [id number] [facility] Progress Note  Jeremy LingoHelio Abdulla 82 y.o. [birth year]   Subjective: Manson PasseyBrown stool with red blood streaks in it this morning per nursing. Denies abdominal pain. Feels ok. Good spirits.  Objective: Vital signs in last 24 hours: Filed Vitals:   02/22/16 0900 02/22/16 1000  BP: 132/77 119/79  Pulse: 77 65  Temp:  97.7  Resp: 28 22    Physical Exam: Gen: elderly, frail, alert, no acute distress HEENT: anicteric sclera CV: RRR Chest: CTA B Abd: soft, nontender, nondistended, +BS Ext: 3+ pitting LE edema  Lab Results:  Recent Labs  02/21/16 2049 02/22/16 0206  NA 141 142  K 4.3 4.7  CL 115* 116*  CO2 14* 16*  GLUCOSE 156* 145*  BUN 54* 51*  CREATININE 4.11* 4.07*  CALCIUM 8.2* 8.1*  MG  --  1.9  PHOS  --  5.0*    Recent Labs  02/21/16 0945 02/21/16 2049  AST 70* 90*  ALT 15* 18  ALKPHOS 54 60  BILITOT 0.4 0.7  PROT 5.1* 5.4*  ALBUMIN 2.5* 2.7*    Recent Labs  02/21/16 0945  02/21/16 2049 02/22/16 0206  WBC 9.4  --  12.1* 12.3*  NEUTROABS 6.8  --   --   --   HGB 6.8*  < > 9.0* 8.7*  HCT 21.5*  < > 28.4* 26.8*  MCV 95.6  --  93.7 93.4  PLT 178  --  170 172  < > = values in this interval not displayed.  Recent Labs  02/21/16 0945  LABPROT 15.5*  INR 1.21      Assessment/Plan: Lower GI bleed likely diverticular. Malignancy possible as is ischemic colitis (less likely). Bleeding is resolving. Hgb 8.7. I would not recommend a colonoscopy due to his advanced age and multiple comorbidities. Continue supportive care. Clear liquid diet. Cards recs noted. Will sign off. Call if questions.   Statia Burdick C. 02/22/2016, 10:31 AM  Pager [phone]  If no answer or after 5 PM call [phone]

## 2016-02-23 DIAGNOSIS — I5022 Chronic systolic (congestive) heart failure: Secondary | ICD-10-CM

## 2016-02-23 DIAGNOSIS — I130 Hypertensive heart and chronic kidney disease with heart failure and stage 1 through stage 4 chronic kidney disease, or unspecified chronic kidney disease: Secondary | ICD-10-CM

## 2016-02-23 DIAGNOSIS — E1122 Type 2 diabetes mellitus with diabetic chronic kidney disease: Secondary | ICD-10-CM

## 2016-02-23 DIAGNOSIS — Z7984 Long term (current) use of oral hypoglycemic drugs: Secondary | ICD-10-CM

## 2016-02-23 DIAGNOSIS — Z951 Presence of aortocoronary bypass graft: Secondary | ICD-10-CM

## 2016-02-23 DIAGNOSIS — I2510 Atherosclerotic heart disease of native coronary artery without angina pectoris: Secondary | ICD-10-CM

## 2016-02-23 DIAGNOSIS — N2581 Secondary hyperparathyroidism of renal origin: Secondary | ICD-10-CM | POA: Insufficient documentation

## 2016-02-23 DIAGNOSIS — N184 Chronic kidney disease, stage 4 (severe): Secondary | ICD-10-CM

## 2016-02-23 DIAGNOSIS — E559 Vitamin D deficiency, unspecified: Secondary | ICD-10-CM

## 2016-02-23 LAB — RENAL FUNCTION PANEL
ANION GAP: 8 (ref 5–15)
Albumin: 2.2 g/dL — ABNORMAL LOW (ref 3.5–5.0)
BUN: 55 mg/dL — ABNORMAL HIGH (ref 6–20)
CHLORIDE: 116 mmol/L — AB (ref 101–111)
CO2: 17 mmol/L — ABNORMAL LOW (ref 22–32)
CREATININE: 4.32 mg/dL — AB (ref 0.61–1.24)
Calcium: 8 mg/dL — ABNORMAL LOW (ref 8.9–10.3)
GFR, EST AFRICAN AMERICAN: 13 mL/min — AB (ref >60)
GFR, EST NON AFRICAN AMERICAN: 11 mL/min — AB (ref >60)
Glucose, Bld: 128 mg/dL — ABNORMAL HIGH (ref 65–99)
POTASSIUM: 4.7 mmol/L (ref 3.5–5.1)
Phosphorus: 4.5 mg/dL (ref 2.5–4.6)
Sodium: 141 mmol/L (ref 135–145)

## 2016-02-23 LAB — RETICULOCYTES
RBC.: 2.64 MIL/uL — ABNORMAL LOW (ref 4.22–5.81)
RETIC CT PCT: 1.7 % (ref 0.4–3.1)
Retic Count, Absolute: 44.9 10*3/uL (ref 19.0–186.0)

## 2016-02-23 LAB — GLUCOSE, CAPILLARY
GLUCOSE-CAPILLARY: 103 mg/dL — AB (ref 65–99)
GLUCOSE-CAPILLARY: 125 mg/dL — AB (ref 65–99)
GLUCOSE-CAPILLARY: 202 mg/dL — AB (ref 65–99)
GLUCOSE-CAPILLARY: 143 mg/dL — AB (ref 65–99)
GLUCOSE-CAPILLARY: 130 mg/dL — AB (ref 65–99)

## 2016-02-23 LAB — IRON AND TIBC
IRON: 8 ug/dL — AB (ref 45–182)
Saturation Ratios: 4 % — ABNORMAL LOW (ref 17.9–39.5)
TIBC: 197 ug/dL — ABNORMAL LOW (ref 250–450)
UIBC: 189 ug/dL

## 2016-02-23 LAB — CBC
HCT: 26.8 % — ABNORMAL LOW (ref 39.0–52.0)
HEMOGLOBIN: 8.4 g/dL — AB (ref 13.0–17.0)
MCH: 29.9 pg (ref 26.0–34.0)
MCHC: 31.3 g/dL (ref 30.0–36.0)
MCV: 95.4 fL (ref 78.0–100.0)
Platelets: 158 10*3/uL (ref 150–400)
RBC: 2.81 MIL/uL — AB (ref 4.22–5.81)
RDW: 15.8 % — ABNORMAL HIGH (ref 11.5–15.5)
WBC: 11.4 10*3/uL — AB (ref 4.0–10.5)

## 2016-02-23 LAB — MAGNESIUM: MAGNESIUM: 1.9 mg/dL (ref 1.7–2.4)

## 2016-02-23 LAB — FERRITIN: Ferritin: 70 ng/mL (ref 24–336)

## 2016-02-23 MED ORDER — SODIUM CHLORIDE 0.9 % IV SOLN
510.0000 mg | Freq: Once | INTRAVENOUS | Status: AC
  Administered 2016-02-23: 510 mg via INTRAVENOUS
  Filled 2016-02-23 (×2): qty 17

## 2016-02-23 MED ORDER — CLONIDINE HCL 0.1 MG PO TABS
0.1000 mg | ORAL_TABLET | Freq: Every day | ORAL | Status: DC
  Filled 2016-02-23: qty 1

## 2016-02-23 MED ORDER — FERROUS SULFATE 325 (65 FE) MG PO TABS: 325.0000 mg | ORAL_TABLET | Freq: Every day | ORAL | Status: DC

## 2016-02-23 MED ORDER — INSULIN ASPART 100 UNIT/ML ~~LOC~~ SOLN
0.0000 [IU] | Freq: Three times a day (TID) | SUBCUTANEOUS | Status: DC
  Administered 2016-02-23: 3 [IU] via SUBCUTANEOUS
  Administered 2016-02-23 – 2016-02-24 (×3): 1 [IU] via SUBCUTANEOUS

## 2016-02-23 MED ORDER — PANTOPRAZOLE SODIUM 40 MG PO TBEC
40.0000 mg | DELAYED_RELEASE_TABLET | Freq: Every day | ORAL | Status: DC
  Administered 2016-02-23 – 2016-02-24 (×2): 40 mg via ORAL
  Filled 2016-02-23: qty 1
  Filled 2016-02-23: qty 2

## 2016-02-23 MED ORDER — ACETAMINOPHEN 325 MG PO TABS: 650.0000 mg | ORAL_TABLET | Freq: Four times a day (QID) | ORAL | Status: DC | PRN

## 2016-02-23 NOTE — Progress Notes (Signed)
TRANSFER NOTE:  [name] is a 82 y.o. pleasant man with past medical history of non-insulin dependent Type 2 DM, hypertension, hyperlipidemia, CKD Stage 4, secondary hyperparathyroidism, hyperthyroidism s/p RAI therapy in 2002, CAD s/p CABG in 1997, CVA, chronic venous insufficiency, BPH, diverticulosis, vitamin D deficiency, and GERD who presented on 3/26 with 2-week history of weakness and 1-3 day history of chest pain and BRBPR found to have symptomatic anemia due to probable diverticular bleed complicated by NSTEMI and AKI on CKD Stage 4. He is on aggrenox and takes naproxen daily at home. On admission he was hemodynamically stable with BP 129/85. His hemoglobin on admission was 6.8 (baseline 13), Cr was 4.10 (baseline 2.5), and troponin was 32.5  FOBT was positive.12-lead EKG revealed ST depressions in the lateral and inferior leads, Q waves in anterior lead, and PAC's. Chest xray revealed cardiomegaly with possible interstitial edema.   He was admitted to ICU for close monitoring. He received 2 units of pRBC with Hg improved to 8-9 (today 8.4). His chest pain resolved in the ED and troponin peaked at 75.41. His BNP was elevated at 1537.2 and 2D-echo revealed EF of 20% with diffuse hypokinesis (last EF 50% in 2013). He was not started on IV heparin  due to acute GI bleed. He was seen by GI and cardiology who recommended supportive care with no invasive procedures due to age and comorbidities.    This morning he reports he is hungry but denies chest pain, fatigue, lightheadedness, dyspnea, nausea, vomiting, abdominal pain, melena, or BRBPR. He feels well enough to go home.   Objective: Vital signs in last 24 hours: Filed Vitals:   02/22/16 1042 02/22/16 1300 02/22/16 2117 02/23/16 0423  BP:  105/71 106/56 92/53  Pulse:  67 76 65  Temp:  98.7 F (37.1 C) 97.6 F (36.4 C) 97.7 F (36.5 C)  TempSrc:  Oral Oral Oral  Resp:  18 18 18   Height: 6' (1.829 m)     Weight: 212 lb 1.3 oz  (96.2 kg)   212 lb 1.3 oz (96.2 kg)  SpO2:  96% 94% 97%   Weight change:   Intake/Output Summary (Last 24 hours) at 02/23/16 0801 Last data filed at 02/22/16 2330  Gross per 24 hour  Intake    150 ml  Output    400 ml  Net   -250 ml     Physical Exam  Constitutional: He is oriented to person, place, and time. No distress.  Chronically ill appearing  HENT:  Head: Normocephalic and atraumatic.  Right Ear: External ear normal.  Left Ear: External ear normal.  Nose: Nose normal.  Mouth/Throat: Oropharynx is clear and moist. No oropharyngeal exudate.  Eyes: EOM are normal. Right eye exhibits no discharge. Left eye exhibits no discharge. No scleral icterus.  Neck: Normal range of motion. Neck supple.  Cardiovascular: Normal rate and regular rhythm.   Pulmonary/Chest: Effort normal and breath sounds normal. No respiratory distress. He has no wheezes. He has no rales.  Could not examine posterior lung fields  Abdominal: Soft. Bowel sounds are normal. He exhibits no distension. There is no tenderness. There is no rebound and no guarding.  Musculoskeletal: Normal range of motion. He exhibits edema. He exhibits no tenderness.  SCD's on b/l LE  Neurological: He is alert and oriented to person, place, and time. No cranial nerve deficit.  Skin: Skin is warm and dry. No rash noted. He is not diaphoretic. No erythema. No pallor.  Psychiatric:  He has a normal mood and affect. His behavior is normal. Judgment and thought content normal.   Lab Results: Basic Metabolic Panel:  Recent Labs Lab 02/22/16 0206 02/23/16 0424  NA 142 141  K 4.7 4.7  CL 116* 116*  CO2 16* 17*  GLUCOSE 145* 128*  BUN 51* 55*  CREATININE 4.07* 4.32*  CALCIUM 8.1* 8.0*  MG 1.9 1.9  PHOS 5.0* 4.5   Liver Function Tests:  Recent Labs Lab 02/21/16 0945 02/21/16 2049 02/23/16 0424  AST 70* 90*  --   ALT 15* 18  --   ALKPHOS 54 60  --   BILITOT 0.4 0.7  --   PROT 5.1* 5.4*  --   ALBUMIN 2.5* 2.7* 2.2*     CBC:  Recent Labs Lab 02/21/16 0945  02/22/16 1839 02/23/16 0424  WBC 9.4  < > 11.1* 11.4*  NEUTROABS 6.8  --   --   --   HGB 6.8*  < > 8.2* 8.4*  HCT 21.5*  < > 25.3* 26.8*  MCV 95.6  < > 94.1 95.4  PLT 178  < > 146* 158  < > = values in this interval not displayed. Cardiac Enzymes:  Recent Labs Lab 02/21/16 2049 02/22/16 0206 02/22/16 0903  TROPONINI 48.04* 75.41* 51.47*   CBG:  Recent Labs Lab 02/22/16 0743 02/22/16 1124 02/22/16 1638 02/22/16 2224 02/22/16 2347 02/23/16 0420  GLUCAP 89 168* 186* 161* 104* 103*   Coagulation:  Recent Labs Lab 02/21/16 0945  LABPROT 15.5*  INR 1.21   Urinalysis:  Recent Labs Lab 02/21/16 1105  COLORURINE YELLOW  LABSPEC 1.016  PHURINE 5.0  GLUCOSEU 250*  HGBUR MODERATE*  BILIRUBINUR NEGATIVE  KETONESUR NEGATIVE  PROTEINUR 100*  NITRITE NEGATIVE  LEUKOCYTESUR NEGATIVE   Micro Results: Recent Results (from the past 240 hour(s))  MRSA PCR Screening     Status: None   Collection Time: 02/21/16  5:30 PM  Result Value Ref Range Status   MRSA by PCR NEGATIVE NEGATIVE Final    Comment:        The GeneXpert MRSA Assay (FDA approved for NASAL specimens only), is one component of a comprehensive MRSA colonization surveillance program. It is not intended to diagnose MRSA infection nor to guide or monitor treatment for MRSA infections.    Studies/Results: Dg Chest 2 View  02/21/2016  CLINICAL DATA:  Possible myocardial infarction. EXAM: CHEST  2 VIEW COMPARISON:  Into 313. FINDINGS: Cardiomegaly. Status post CABG. Linear markings of the lung bases could represent early interstitial edema. No consolidation. No significant pleural effusion. No visible pneumothorax. IMPRESSION: Worsening aeration from priors. Cardiomegaly. Early interstitial edema is possible. Electronically Signed   By: Elsie Stain M.D.   On: 02/21/2016 10:05   Dg Chest Port 1 View  02/22/2016  CLINICAL DATA:  Gastrointestinal bleeding,  myocardial infarction, diabetes EXAM: PORTABLE CHEST 1 VIEW COMPARISON:  PA and lateral chest x-ray of February 21, 2016 FINDINGS: The lungs are well-expanded. The interstitial markings remain increased bilaterally. The cardiac silhouette remains enlarged. The pulmonary vascularity is mildly engorged but less prominent than on yesterday's study. There are post CABG changes. There is no significant pleural effusion and there is no pneumothorax. The trachea is midline. The bony thorax exhibits no acute abnormality. IMPRESSION: CHF superimposed upon COPD. There has been slight interval improvement since yesterday's study. Electronically Signed   By: David  [place] M.D.   On: 02/22/2016 07:26   Medications: I have reviewed the patient's current medications. Scheduled Meds: .  amLODipine  5 mg Oral Daily  . antiseptic oral rinse  7 mL Mouth Rinse BID  . cloNIDine  0.1 mg Oral TID  . insulin aspart  0-15 Units Subcutaneous 6 times per day  . metoprolol tartrate  12.5 mg Oral BID  . pantoprazole (PROTONIX) IV  40 mg Intravenous QHS  . simvastatin  20 mg Oral QHS  . tamsulosin  0.4 mg Oral Daily   Continuous Infusions: . sodium chloride     PRN Meds:.sodium chloride, acetaminophen, ondansetron (ZOFRAN) IV Assessment/Plan:   Symptomatic Anemia due to probable diverticular bleed - Currently hemodynamically stable with Hg 8.4 down from baseline 13 with no further GI bleeding. Pt is s/p 2 units of pRBC. Etiology of GI bleed thought to be diverticulosis in setting of NSAID and AP use. Pt also has worsening CKD Stage 4. No colonoscopy or EGD planned by GI. -Obtain reticulocyte, ferritin, iron, and TIBC -If Tstat <20% consider feraheme infusion or PO ferrous sulfate -Monitor CBC and transfuse if Hg <7  -Discontinue home naproxen and avoid NSAID's -Hold home aggrenox 200-25 mg BID for now, consider restarting at discharge  -Change IV protonix 40 mg daily to PO protonix 40 mg daily  -Advance diet from clears to  heart healthy  -Obtain PT and OT consults   NSTEMI with history of CAD s/p CABG - Currently denies chest pain. Etiology most likely demand ischemia in setting of symptomatic anemia from lower GI bleeding. Pt not candidate for invasive ischemic work-up per cardiology. -Continue home lopresor 12.5 mg BID and simvastatin 20 mg daily  -Hold home benazepril 20 mg daily  -Hold home agrenox 200-25 mg BID for now, considering restarting at discharge -No ischemic work-up per cardiology   Non-oliguric AKI on CKD Stage 4 - Cr 4.32 up from baseline 2.5 with normal urine output. Etiology most likely pre-renal azotemia due to hypovolemia vs  ischemic ATN from GI bleed vs progression of CKD Stage 4. Pt not candidate for HD due to age and comorbidites. UA on 02/21/16 with mild proteinuria.  -Monitor daily weights and strict I & O's -Monitor BMP -Avoid nephrotoxins -Hold home lasix 80 mg daily and benazepril 20 mg daily  -Will defer renal US at this time  Chronic systolic CHF - Currently seems euvolemic. Has chronic LE edema which seems at baseline. BNP was elevated at 1537.2 on admission and 2D-echo revealed EF of 20% with diffuse hypokinesis (last EF 50% in 2013). Chest xray on 3/27 with CHF with improvement since last one on 3/26.  -Monitor daily weights and strict I & O's -Hold home lasix 80 mg daily and benazepril 20 mg daily  -Continue home lopresor 12.5 mg BID   Hypertension - Currently with soft blood pressure 92/53.  -Wean recently started clonidine from 0.1 mg TID to daily  -Hold home lasix 80 mg daily  -Continue home lopresor 12.5 mg BID  -Continue home flomax  0.4 mg daily for BPH -Continue home amlodipine 5 mg daily   Non-insulin dependent Type 2 DM - CBG 125 this AM. Last A1c 6 on 06/12/15. Pt at home on glipizide 5 mg daily. -Obtain A1c -Start SSI if BG >150 -Hold home glipizide 5 mg daily   History of Vitamin D deficiency - Last vitamin D level was 15 on 06/12/15. Pt was to take 8 weeks  of high dose vitamin D therapy.  -Obtain 25-OH vitamin D, if <20 restart high dose therapy, if >20 start 1000 U daily   Diet: Heart healthy  DVT Ppx: SCD's Code: DNR  Dispo: Disposition is deferred at this time, awaiting improvement of current medical problems.  Anticipated discharge in approximately 1-2 day(s).   The patient does have a current PCP (Otis Brace, MD) and does need an [facility] hospital follow-up appointment after discharge.  The patient does not have transportation limitations that hinder transportation to clinic appointments.  .Services Needed at time of discharge: Y = Yes, Blank = No PT:   OT:   RN:   Equipment:   Other:     LOS: 2 days   Otis Brace, MD 02/23/2016, 8:01 AM

## 2016-02-23 NOTE — Evaluation (Signed)
Physical Therapy Evaluation Patient Details Name: Jeremy Sanders MRN: [id number] DOB: Jul 02, 1925 Today's Date: 02/23/2016   History of Present Illness  [name] is a 82 y.o. pleasant man with past medical history of non-insulin dependent Type 2 DM, hypertension, hyperlipidemia, CKD Stage 4, secondary hyperparathyroidism, hyperthyroidism s/p RAI therapy in 2002, CAD s/p CABG in 1997, CVA, chronic venous insufficiency, BPH, diverticulosis, vitamin D deficiency, and GERD who presented on 3/26 with 2-week history of weakness and 1-3 day history of chest pain and BRBPR found to have symptomatic anemia due to probable diverticular bleed complicated by NSTEMI and AKI on CKD Stage 4.   Clinical Impression  Patient presents with decreased independence with mobility due to deficits listed in PT problem list.  He will benefit from skilled PT in the acute setting to allow return home following SNF level rehab stay.    Follow Up Recommendations SNF    Equipment Recommendations  Rolling walker with 5" wheels    Recommendations for Other Services       Precautions / Restrictions Precautions Precautions: Fall      Mobility  Bed Mobility Overal bed mobility: Needs Assistance Bed Mobility: Supine to Sit;Sit to Supine     Supine to sit: Mod assist Sit to supine: Mod assist   General bed mobility comments: cues for use of rail and assist to lift trunk upright. assist for legs into bed to supine  Transfers Overall transfer level: Needs assistance Equipment used: Rolling walker (2 wheeled) Transfers: Sit to/from [employer] Sit to Stand: Min assist;Mod assist Stand pivot transfers: Min assist       General transfer comment: lifting and lowering assist about 20% help; cues for hand placement; pivotal steps to [facility], then back to bed; sit to stand twice for hygiene from toilet due to soiled with fecal incontinence in bed  Ambulation/Gait                Stairs             Wheelchair Mobility    Modified Rankin (Stroke Patients Only)       Balance Overall balance assessment: Needs assistance Sitting-balance support: Feet supported Sitting balance-Leahy Scale: Fair     Standing balance support: Bilateral upper extremity supported Standing balance-Leahy Scale: Poor Standing balance comment: UE support for balance; stood about 3 minutes while assist for hygiene                             Pertinent Vitals/Pain Pain Assessment: No/denies pain    Home Living Family/patient expects to be discharged to:: Skilled nursing facility Living Arrangements: Spouse/significant other Available Help at Discharge: Family Type of Home: House Home Access: Stairs to enter Entrance Stairs-Rails: Left Entrance Stairs-Number of Steps: 3 Home Layout: One level Home Equipment: Environmental consultant - 2 wheels (reports his walker has gotten lost in the hospital) Additional Comments: patient slow to respond to questions and perseverates on walker missing since admission; information gleaned from previous admission    Prior Function Level of Independence: Independent with assistive device(s)               Hand Dominance   Dominant Hand: Right    Extremity/Trunk Assessment               Lower Extremity Assessment: RLE deficits/detail;LLE deficits/detail RLE Deficits / Details: 3-/5 throughout and noted chronic swelling in lower legs/feet LLE Deficits / Details: 3-/5 throughout and noted chronic swelling  in lower legs/feet     Communication   Communication: HOH  Cognition Arousal/Alertness: Awake/alert Behavior During Therapy: WFL for tasks assessed/performed Overall Cognitive Status: No family/caregiver present to determine baseline cognitive functioning                      General Comments General comments (skin integrity, edema, etc.): lower legs with chronic edema and skin changes from chronic swelling    Exercises         Assessment/Plan    PT Assessment    PT Diagnosis Generalized weakness;Difficulty walking   PT Problem List    PT Treatment Interventions     PT Goals (Current goals can be found in the Care Plan section) Acute Rehab PT Goals Patient Stated Goal: To get stronger PT Goal Formulation: With patient Time For Goal Achievement: 03/08/16 Potential to Achieve Goals: Fair    Frequency     Barriers to discharge        Co-evaluation               End of Session Equipment Utilized During Treatment: Oxygen Activity Tolerance: Patient limited by fatigue Patient left: in bed;with call bell/phone within reach;with bed alarm set           Time: 1135-1207 PT Time Calculation (min) (ACUTE ONLY): 32 min   Charges:   PT Evaluation $PT Eval Moderate Complexity: 1 Procedure PT Treatments $Therapeutic Activity: 8-22 mins   PT G CodesElray Sanders:        Jeremy Sanders 02/23/2016, 12:32 PM  Jeremy Sanders, PT [phone] 02/23/2016

## 2016-02-23 NOTE — Progress Notes (Signed)
CSW received consult regarding PT recommendation of SNF at discharge.  Patient is refusing SNF and wants to return home with home health and a walker to be with his wife.  CSW signing off.   Osborne Cascoadia Tarena Gockley LCSWA [phone]

## 2016-02-23 NOTE — Progress Notes (Signed)
CSW spoke with patient's wife on the phone. She stated that there were two social workers at her house speaking with her at the time I called. She expressed appreciation for CSW call.  Osborne Cascoadia Sierria Bruney LCSWA [phone]

## 2016-02-23 NOTE — Progress Notes (Signed)
Pharmacist Heart Failure Core Measure Documentation  Assessment: Jeremy Sanders has an EF documented as 20% on 02/22/16 by Echo.  Rationale: Heart failure patients with left ventricular systolic dysfunction (LVSD) and an EF < 40% should be prescribed an angiotensin converting enzyme inhibitor (ACEI) or angiotensin receptor blocker (ARB) at discharge unless a contraindication is documented in the medical record.  This patient is not currently on an ACEI or ARB for HF.  This note is being placed in the record in order to provide documentation that a contraindication to the use of these agents is present for this encounter.  ACE Inhibitor or Angiotensin Receptor Blocker is contraindicated (specify all that apply)  []   ACEI allergy AND ARB allergy []   Angioedema []   Moderate or severe aortic stenosis []   Hyperkalemia [x]   Hypotension []   Renal artery stenosis [x]   Worsening renal function, preexisting renal disease or dysfunction    Rever Pichette S. Merilynn Finlandobertson, PharmD, BCPS Clinical Staff Pharmacist Pager [phone]  Misty StanleyRobertson, Athanasios Heldman Stillinger 02/23/2016 2:30 PM

## 2016-02-23 NOTE — Evaluation (Signed)
Occupational Therapy Evaluation Patient Details Name: Jeremy Sanders MRN: [id number] DOB: 07/11/1925 Today's Date: 02/23/2016    History of Present Illness Jeremy Sanders is a 82 y.o. pleasant man with past medical history of non-insulin dependent Type 2 DM, hypertension, hyperlipidemia, CKD Stage 4, secondary hyperparathyroidism, hyperthyroidism s/p RAI therapy in 2002, CAD s/p CABG in 1997, CVA, chronic venous insufficiency, BPH, diverticulosis, vitamin D deficiency, and GERD who presented on 3/26 with 2-week history of weakness and 1-3 day history of chest pain and BRBPR found to have symptomatic anemia due to probable diverticular bleed complicated by NSTEMI and AKI on CKD Stage 4.    Clinical Impression   Pt awakened, limited evaluation. Pt was independent with assistive devices per his report prior to admission. Pt presents with generalized weakness, decreased activity tolerance, impaired balance and impaired cognition (unsure of baseline). He requires min to max assist with self care.  Pt lives with his 45 year old wife and will need post acute rehab in SNF prior to return home. Will follow acutely.    Follow Up Recommendations  SNF;Supervision/Assistance - 24 hour    Equipment Recommendations   (defer to next venue)    Recommendations for Other Services       Precautions / Restrictions Precautions Precautions: Fall Restrictions Weight Bearing Restrictions: No      Mobility Bed Mobility Overal bed mobility: Needs Assistance Bed Mobility: Supine to Sit;Sit to Supine     Supine to sit: Mod assist Sit to supine: Mod assist   General bed mobility comments: cues for use of rail and assist to lift trunk upright. assist for legs into bed to supine  Transfers Overall transfer level: Needs assistance Equipment used: Rolling walker (2 wheeled) Transfers: Sit to/from Stand Sit to Stand: Mod assist Stand pivot transfers: Min assist       General transfer comment:  performed x 1 from bed only, cues for technique, assist to rise    Balance Overall balance assessment: Needs assistance Sitting-balance support: Feet supported Sitting balance-Leahy Scale: Fair     Standing balance support: Bilateral upper extremity supported Standing balance-Leahy Scale: Poor Standing balance comment: UE support for balance; stood about 3 minutes while assist for hygiene                            ADL Overall ADL's : Needs assistance/impaired Eating/Feeding: Set up;Sitting   Grooming: Wash/dry hands;Wash/dry face;Set up;Sitting   Upper Body Bathing: Minimal assitance;Sitting   Lower Body Bathing: Maximal assistance;Sit to/from stand   Upper Body Dressing : Minimal assistance;Sitting   Lower Body Dressing: Maximal assistance;Sit to/from stand               Functional mobility during ADLs: Moderate assistance;Rolling walker (stood and took 2 steps toward [facility] )       Vision     Perception     Praxis      Pertinent Vitals/Pain Pain Assessment: No/denies pain     Hand Dominance Right   Extremity/Trunk Assessment Upper Extremity Assessment Upper Extremity Assessment: Generalized weakness   Lower Extremity Assessment Lower Extremity Assessment: Defer to PT evaluation RLE Deficits / Details: 3-/5 throughout and noted chronic swelling in lower legs/feet LLE Deficits / Details: 3-/5 throughout and noted chronic swelling in lower legs/feet       Communication Communication Communication: HOH   Cognition Arousal/Alertness: Lethargic (awakened for visit) Behavior During Therapy: WFL for tasks assessed/performed Overall Cognitive Status: No family/caregiver present to  determine baseline cognitive functioning                     General Comments       Exercises       Shoulder Instructions      Home Living Family/patient expects to be discharged to:: Skilled nursing facility Living Arrangements: Spouse/significant  other Available Help at Discharge: Family Type of Home: House Home Access: Stairs to enter [employer] CorporationEntrance Stairs-Number of Steps: 3 Entrance Stairs-Rails: Left Home Layout: One level               Home Equipment: Walker - 2 wheels   Additional Comments: pt sleepy, answering yes/no questions, but not able to offer information about home set up.       Prior Functioning/Environment Level of Independence: Independent with assistive device(s)             OT Diagnosis: Generalized weakness;Cognitive deficits   OT Problem List: Decreased strength;Decreased activity tolerance;Impaired balance (sitting and/or standing);Decreased cognition;Decreased safety awareness;Decreased knowledge of use of DME or AE   OT Treatment/Interventions: Self-care/ADL training;DME and/or AE instruction;Therapeutic activities;Patient/family education;Balance training;Cognitive remediation/compensation    OT Goals(Current goals can be found in the care plan section) Acute Rehab OT Goals Patient Stated Goal: To get stronger OT Goal Formulation: With patient Time For Goal Achievement: 03/08/16 Potential to Achieve Goals: Good ADL Goals Pt Will Perform Grooming: with supervision;standing Pt Will Perform Upper Body Bathing: with supervision;sitting Pt Will Perform Lower Body Bathing: with min assist;sit to/from stand Pt Will Perform Upper Body Dressing: with supervision;sitting Pt Will Perform Lower Body Dressing: with min assist;sit to/from stand Pt Will Transfer to Toilet: with supervision;ambulating Pt Will Perform Toileting - Clothing Manipulation and hygiene: with supervision;sit to/from stand  OT Frequency: Min 2X/week   Barriers to D/C: Decreased caregiver support          Co-evaluation              End of Session Equipment Utilized During Treatment: Gait belt;Rolling walker;Oxygen  Activity Tolerance: Patient limited by fatigue Patient left: in bed;with call bell/phone within reach;with  bed alarm set   Time: 1430-1446 OT Time Calculation (min): 16 min Charges:  OT General Charges $OT Visit: 1 Procedure OT Evaluation $OT Eval Moderate Complexity: 1 Procedure G-Codes:    Jeremy Sanders, Jeremy Sanders 02/23/2016, 2:58 PM [phone]

## 2016-02-23 NOTE — Progress Notes (Signed)
     SUBJECTIVE: no pain or SOB  BP 92/53 mmHg  Pulse 65  Temp(Src) 97.7 F (36.5 C) (Oral)  Resp 18  Ht 6' (1.829 m)  Wt 212 lb 1.3 oz (96.2 kg)  BMI 28.76 kg/m2  SpO2 97%  Intake/Output Summary (Last 24 hours) at 02/23/16 1029 Last data filed at 02/23/16 0818  Gross per 24 hour  Intake    630 ml  Output    250 ml  Net    380 ml    PHYSICAL EXAM General: Well developed, well nourished, in no acute distress. Alert and oriented x 3.  Psych:  Good affect, responds appropriately Neck: No JVD. No masses noted.  Lungs: Clear bilaterally with no wheezes or rhonci noted.  Heart: RRR with no murmurs noted. Abdomen: Bowel sounds are present. Soft, non-tender.  Extremities: 1-2+ bilateral lower extremity edema.   LABS: Basic Metabolic Panel:  Recent Labs  16/08/9602/27/17 0206 02/23/16 0424  NA 142 141  K 4.7 4.7  CL 116* 116*  CO2 16* 17*  GLUCOSE 145* 128*  BUN 51* 55*  CREATININE 4.07* 4.32*  CALCIUM 8.1* 8.0*  MG 1.9 1.9  PHOS 5.0* 4.5   CBC:  Recent Labs  02/21/16 0945  02/22/16 1839 02/23/16 0424  WBC 9.4  < > 11.1* 11.4*  NEUTROABS 6.8  --   --   --   HGB 6.8*  < > 8.2* 8.4*  HCT 21.5*  < > 25.3* 26.8*  MCV 95.6  < > 94.1 95.4  PLT 178  < > 146* 158  < > = values in this interval not displayed. Cardiac Enzymes:  Recent Labs  02/21/16 2049 02/22/16 0206 02/22/16 0903  TROPONINI 48.04* 75.41* 51.47*   Current Meds: . amLODipine  5 mg Oral Daily  . antiseptic oral rinse  7 mL Mouth Rinse BID  . cloNIDine  0.1 mg Oral Daily  . metoprolol tartrate  12.5 mg Oral BID  . pantoprazole  40 mg Oral Daily  . simvastatin  20 mg Oral QHS  . tamsulosin  0.4 mg Oral Daily     ASSESSMENT AND PLAN:  1. CAD/NSTEMI: Pt has known CAD with prior CABG. No recent ischemic evaluation. Profound anemia on admission from GI bleeding. Will manage medically now as he is a poor candidate for invasive cardiac evaluation with anemia, GI bleeding and renal insufficiency. We  cannot anti-coagulate at this time with GI bleeding. Continue beta blocker and statin.  2. Anemia: He has been transfused. H/H stable. He has been seen by GI and felt to be a poor candidate for endoscopy. No invasive evaluation planned.   Will sign off. Please call with questions.    MCALHANY,CHRISTOPHER  3/28/201710:29 AM

## 2016-02-24 DIAGNOSIS — I420 Dilated cardiomyopathy: Secondary | ICD-10-CM

## 2016-02-24 LAB — VITAMIN D 25 HYDROXY (VIT D DEFICIENCY, FRACTURES): Vit D, 25-Hydroxy: 9.5 ng/mL — ABNORMAL LOW (ref 30.0–100.0)

## 2016-02-24 LAB — BASIC METABOLIC PANEL
Anion gap: 9 (ref 5–15)
BUN: 61 mg/dL — AB (ref 6–20)
CHLORIDE: 114 mmol/L — AB (ref 101–111)
CO2: 18 mmol/L — AB (ref 22–32)
CREATININE: 4.64 mg/dL — AB (ref 0.61–1.24)
Calcium: 7.6 mg/dL — ABNORMAL LOW (ref 8.9–10.3)
GFR calc Af Amer: 12 mL/min — ABNORMAL LOW (ref >60)
GFR calc non Af Amer: 10 mL/min — ABNORMAL LOW (ref >60)
GLUCOSE: 177 mg/dL — AB (ref 65–99)
POTASSIUM: 4.8 mmol/L (ref 3.5–5.1)
Sodium: 141 mmol/L (ref 135–145)

## 2016-02-24 LAB — CBC WITH DIFFERENTIAL/PLATELET
Basophils Absolute: 0 10*3/uL (ref 0.0–0.1)
Basophils Relative: 0 %
EOS PCT: 4 %
Eosinophils Absolute: 0.3 10*3/uL (ref 0.0–0.7)
HEMATOCRIT: 25.6 % — AB (ref 39.0–52.0)
Hemoglobin: 8 g/dL — ABNORMAL LOW (ref 13.0–17.0)
LYMPHS ABS: 1.6 10*3/uL (ref 0.7–4.0)
LYMPHS PCT: 16 %
MCH: 30.1 pg (ref 26.0–34.0)
MCHC: 31.3 g/dL (ref 30.0–36.0)
MCV: 96.2 fL (ref 78.0–100.0)
MONO ABS: 0.9 10*3/uL (ref 0.1–1.0)
Monocytes Relative: 10 %
NEUTROS ABS: 6.7 10*3/uL (ref 1.7–7.7)
Neutrophils Relative %: 70 %
Platelets: 169 10*3/uL (ref 150–400)
RBC: 2.66 MIL/uL — ABNORMAL LOW (ref 4.22–5.81)
RDW: 15.8 % — AB (ref 11.5–15.5)
WBC: 9.6 10*3/uL (ref 4.0–10.5)

## 2016-02-24 LAB — HEMOGLOBIN A1C
Hgb A1c MFr Bld: 6.1 % — ABNORMAL HIGH (ref 4.8–5.6)
Mean Plasma Glucose: 128 mg/dL

## 2016-02-24 LAB — GLUCOSE, CAPILLARY
GLUCOSE-CAPILLARY: 145 mg/dL — AB (ref 65–99)
Glucose-Capillary: 148 mg/dL — ABNORMAL HIGH (ref 65–99)
Glucose-Capillary: 129 mg/dL — ABNORMAL HIGH (ref 65–99)

## 2016-02-24 LAB — PREALBUMIN: PREALBUMIN: 10.6 mg/dL — AB (ref 18–38)

## 2016-02-24 MED ORDER — DOCUSATE SODIUM 100 MG PO CAPS: 100.0000 mg | ORAL_CAPSULE | Freq: Every day | ORAL | Status: AC

## 2016-02-24 MED ORDER — FERROUS SULFATE 325 (65 FE) MG PO TABS: 325.0000 mg | ORAL_TABLET | Freq: Every day | ORAL | Status: AC

## 2016-02-24 MED ORDER — VITAMIN D (ERGOCALCIFEROL) 1.25 MG (50000 UNIT) PO CAPS: 50000.0000 [IU] | ORAL_CAPSULE | ORAL | Status: AC

## 2016-02-24 MED ORDER — DOCUSATE SODIUM 100 MG PO CAPS
100.0000 mg | ORAL_CAPSULE | Freq: Every day | ORAL | Status: DC
  Administered 2016-02-24: 100 mg via ORAL
  Filled 2016-02-24: qty 1

## 2016-02-24 MED ORDER — VITAMIN D (ERGOCALCIFEROL) 1.25 MG (50000 UNIT) PO CAPS
50000.0000 [IU] | ORAL_CAPSULE | ORAL | Status: DC
  Administered 2016-02-24: 50000 [IU] via ORAL
  Filled 2016-02-24: qty 1

## 2016-02-24 MED ORDER — PRO-STAT SUGAR FREE PO LIQD
30.0000 mL | Freq: Two times a day (BID) | ORAL | Status: DC
  Administered 2016-02-24: 30 mL via ORAL
  Filled 2016-02-24: qty 30

## 2016-02-24 MED ORDER — FERROUS SULFATE 325 (65 FE) MG PO TABS: 325.0000 mg | ORAL_TABLET | Freq: Every day | ORAL | Status: DC

## 2016-02-24 NOTE — Progress Notes (Addendum)
Patient will DC to: [facility] Anticipated DC date: 02/24/16 Family notified: Wife Transport by: Dorma RussellPTAR   PASSAR given to [facility] admissions. [id number] [phone]  CSW signing off.  Cristobal GoldmannNadia Carlia Bomkamp, [place]CSWA Clinical Social Worker [phone]

## 2016-02-24 NOTE — Progress Notes (Signed)
Subjective: Patient was seen and examined at bedside this morning. Reports feeling well. He is able to tolerate PO intake and denies having any pain anywhere. No other complaints. Nursing note mentions a scrotal lesion, area was examined during physical exam. Patient denies having any pain in the scrotal/ genital region. Denies having any dysuria.   Objective: Vital signs in last 24 hours: Filed Vitals:   02/23/16 1956 02/24/16 0430 02/24/16 0749 02/24/16 1444  BP: 119/71 114/75  98/52  Pulse: 76 65  63  Temp: 98.1 F (36.7 C) 97.9 F (36.6 C)  97.6 F (36.4 C)  TempSrc: Oral Oral  Oral  Resp: Height:      Weight:   204 lb 3.2 oz (92.625 kg)   SpO2: 97% 98%  96%   Weight change:   Intake/Output Summary (Last 24 hours) at 02/24/16 1501 Last data filed at 02/24/16 1300  Gross per 24 hour  Intake    480 ml  Output      0 ml  Net    480 ml   Physical Exam  Constitutional: He is oriented to person, place, and time. No distress.  Mouth/Throat: Oropharynx is clear and moist. No oropharyngeal exudate.  Eyes: EOMI are normal.  Neck: Normal range of motion. Neck supple.  Cardiovascular: Normal rate and regular rhythm.  Pulmonary/Chest: Effort normal and breath sounds normal. No respiratory distress. He has no wheezes. He has no rales.  Abdominal: Soft. Bowel sounds are normal. He exhibits no distension. There is no tenderness. There is no rebound and no guarding.  Musculoskeletal: Normal range of motion. He exhibits edema. He exhibits no tenderness.  SCD's on b/l LE  Neurological: He is alert and oriented to person, place, and time.  Skin: Skin is warm and dry. No rash noted. He is not diaphoretic. No erythema. No pallor.  Genital: Exam performed in the presence of nursing staff.  1 cm furuncle noted in the left groin area. It is draining small amounts of pus spontaneously. No increased erythema or warmth. Area is non-tender to palpation. No lesions noted on the  scrotum. Condom catheter in place.  Psychiatric: He has a normal mood and affect. His behavior is normal. Judgment and thought content normal.   Lab Results: Basic Metabolic Panel:  Recent Labs Lab 02/22/16 0206 02/23/16 0424 02/24/16 0310  NA 142 141 141  K 4.7 4.7 4.8  CL 116* 116* 114*  CO2 16* 17* 18*  GLUCOSE 145* 128* 177*  BUN 51* 55* 61*  CREATININE 4.07* 4.32* 4.64*  CALCIUM 8.1* 8.0* 7.6*  MG 1.9 1.9  --   PHOS 5.0* 4.5  --    Liver Function Tests:  Recent Labs Lab 02/21/16 0945 02/21/16 2049 02/23/16 0424  AST 70* 90*  --   ALT 15* 18  --   ALKPHOS 54 60  --   BILITOT 0.4 0.7  --   PROT 5.1* 5.4*  --   ALBUMIN 2.5* 2.7* 2.2*   CBC:  Recent Labs Lab 02/21/16 0945  02/23/16 0424 02/24/16 0310  WBC 9.4  < > 11.4* 9.6  NEUTROABS 6.8  --   --  6.7  HGB 6.8*  < > 8.4* 8.0*  HCT 21.5*  < > 26.8* 25.6*  MCV 95.6  < > 95.4 96.2  PLT 178  < > 158 169  < > = values in this interval not displayed. Cardiac Enzymes:  Recent Labs Lab 02/21/16 2049 02/22/16 0206 02/22/16  [id number]  TROPONINI 48.04* 75.41* 51.47*   CBG:  Recent Labs Lab 02/23/16 1124 02/23/16 1634 02/23/16 2042 02/24/16 0405 02/24/16 0747 02/24/16 1211  GLUCAP 202* 143* 130* 145* 148* 129*   Hemoglobin A1C:  Recent Labs Lab 02/23/16 0904  HGBA1C 6.1*   Coagulation:  Recent Labs Lab 02/21/16 0945  LABPROT 15.5*  INR 1.21   Anemia Panel:  Recent Labs Lab 02/23/16 0904  FERRITIN 70  TIBC 197*  IRON 8*  RETICCTPCT 1.7   Urinalysis:  Recent Labs Lab 02/21/16 1105  COLORURINE YELLOW  LABSPEC 1.016  PHURINE 5.0  GLUCOSEU 250*  HGBUR MODERATE*  BILIRUBINUR NEGATIVE  KETONESUR NEGATIVE  PROTEINUR 100*  NITRITE NEGATIVE  LEUKOCYTESUR NEGATIVE   Micro Results: Recent Results (from the past 240 hour(s))  MRSA PCR Screening     Status: None   Collection Time: 02/21/16  5:30 PM  Result Value Ref Range Status   MRSA by PCR NEGATIVE NEGATIVE Final    Comment:         The GeneXpert MRSA Assay (FDA approved for NASAL specimens only), is one component of a comprehensive MRSA colonization surveillance program. It is not intended to diagnose MRSA infection nor to guide or monitor treatment for MRSA infections.    Studies/Results: No results found. Medications: I have reviewed the patient's current medications. Scheduled Meds: . amLODipine  5 mg Oral Daily  . antiseptic oral rinse  7 mL Mouth Rinse BID  . docusate sodium  100 mg Oral Daily  . feeding supplement (PRO-STAT SUGAR FREE 64)  30 mL Oral BID  . [START ON 02/25/2016] ferrous sulfate  325 mg Oral Q breakfast  . insulin aspart  0-9 Units Subcutaneous TID WC  . metoprolol tartrate  12.5 mg Oral BID  . pantoprazole  40 mg Oral Daily  . simvastatin  20 mg Oral QHS  . tamsulosin  0.4 mg Oral Daily  . 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 Pt is s/p 2 units of pRBC. Etiology of GI bleed thought to be diverticulosis in setting of NSAID and AP use. Pt also has worsening CKD Stage 4. No colonoscopy or EGD planned by GI. Iron studies showing reticulocytes 1.7, iron 8, TIBC 197, saturation ratio 4%, and ferritin 70. Patient already received a ferraheme  transfusion on 02/23/16. -Monitor CBC and transfuse if Hg <7  -Started Ferrous sulfate 325 mg daily -Colace 100 mg daily  -Discontinue home naproxen and avoid NSAID's -Hold home aggrenox 200-25 mg BID for now, consider restarting at discharge  -protonix 40 mg daily   NSTEMI with history of CAD s/p CABG - Currently denies chest pain. Etiology most likely demand ischemia in setting of symptomatic anemia from lower GI bleeding. Pt not candidate for invasive ischemic work-up per cardiology. -Continue home lopressor 12.5 mg BID and simvastatin  20 mg daily  -Hold home benazepril 20 mg daily  -Hold home agrenox 200-25 mg BID for now, considering restarting at discharge -No ischemic work-up per cardiology   Non-oliguric AKI on CKD Stage 4 - Cr 4.32 up from baseline 2.5 with normal urine output. Etiology most likely pre-renal azotemia due to hypovolemia vs ischemic ATN from GI bleed vs progression of CKD Stage 4. Pt not candidate for HD due to age and comorbidites. UA on 02/21/16 with mild proteinuria.  -Monitor daily weights and strict I & O's -Monitor BMP -Avoid nephrotoxins -Restart home lasix 80 mg daily upon discharge  -HOLD home benazepril 20 mg daily  -Will defer renal US at this time  Chronic systolic CHF - Currently seems euvolemic. Has chronic LE edema which seems at baseline. BNP was elevated at 1537.2 on admission and 2D-echo revealed EF of 20% with diffuse hypokinesis (last EF 50% in 2013). Chest xray on 3/27 with CHF with improvement since last one on 3/26.  -Monitor daily weights and strict I & O's -Restart home lasix 80 mg daily upon discharge  -HOLD home benazepril 20 mg daily  -Continue home lopresor 12.5 mg BID   Furuncle: 1 cm furuncle noted in the L groin region draining pus spontaneously. No pain on palpation. No increased erythema or warmth in the area.  -Supportive care -Make sure area is clean and dry   Hypertension - Currently with soft blood pressure 92/53.   -Restart home lasix 80 mg daily upon discharge  -Continue home lopresor 12.5 mg BID  -Continue home flomax 0.4 mg daily for BPH -HOLD home amlodipine 5 mg daily   Non-insulin dependent Type 2 DM - CBG 125 this AM. Last A1c 6 on 06/12/15. Pt at home on glipizide 5 mg daily. -Obtain A1c -Start SSI if BG >150 -Hold home glipizide 5 mg daily   History of Vitamin D deficiency - Last vitamin D level was 15 on 06/12/15. Pt was to take 8 weeks of high dose vitamin D therapy. Vitamin D level low at 9.5.  -Restart vitamin D 50,000 units 1 capsule weekly   Diet: Heart healthy  DVT Ppx: SCD's Code: DNR   Dispo: Patient is to be transferred to SNF today once bed becomes available.   The patient does have a current PCP (Otis Brace, MD) and does need an [facility] hospital follow-up appointment after discharge.  The patient does not have transportation limitations that hinder transportation to clinic appointments.  .Services Needed at time of discharge: Y = Yes, Blank = No PT:   OT:   RN:   Equipment:   Other:     LOS: 3 days   John Giovanni, MD 02/24/2016, 3:01 PM

## 2016-02-24 NOTE — Care Management Important Message (Signed)
Important Message  Patient Details  Name: Jeremy LingoHelio Sanders MRN: [id number] Date of Birth: 14-Apr-1925   Medicare Important Message Given:  Yes    [name], Saiya Crist Abena 02/24/2016, 1:43 PM

## 2016-02-24 NOTE — Progress Notes (Signed)
Occupational Therapy Treatment Patient Details Name: Jeremy Sanders MRN: [id number] DOB: [birth year] Today's Date: 02/24/2016    History of present illness [name] is a 82 y.o. pleasant man with past medical history of non-insulin dependent Type 2 DM, hypertension, hyperlipidemia, CKD Stage 4, secondary hyperparathyroidism, hyperthyroidism s/p RAI therapy in 2002, CAD s/p CABG in 1997, CVA, chronic venous insufficiency, BPH, diverticulosis, vitamin D deficiency, and GERD who presented on 3/26 with 2-week history of weakness and 1-3 day history of chest pain and BRBPR found to have symptomatic anemia due to probable diverticular bleed complicated by NSTEMI and AKI on CKD Stage 4.    OT comments  Pt with poor sitting and standing balance. Required moderate assistance to EOB, minimal assistance to stand from bed and transfer to chair and max assist for donning socks. Demonstrating poor sitting and standing balance.  Pt wants to go home and check on his wife. He states he will be able to function better in his own environment.  Follow Up Recommendations  SNF;Supervision/Assistance - 24 hour (HHOT and aide if pt refuses SNF)    Equipment Recommendations       Recommendations for Other Services      Precautions / Restrictions Precautions Precautions: Fall       Mobility Bed Mobility Overal bed mobility: Needs Assistance Bed Mobility: Supine to Sit     Supine to sit: Mod assist     General bed mobility comments: refused to use rail when cued, pulled up holding therapist's hand.  Transfers Overall transfer level: Needs assistance Equipment used: Rolling walker (2 wheeled) Transfers: Sit to/from [employer] Sit to Stand: Min assist Stand pivot transfers: Min assist       General transfer comment: min assist to rise from bed    Balance Overall balance assessment: Needs assistance Sitting-balance support: Feet supported Sitting balance-Leahy Scale:  Poor       Standing balance-Leahy Scale: Poor                     ADL Overall ADL's : Needs assistance/impaired Eating/Feeding: Set up;Sitting   Grooming: Wash/dry hands;Sitting;Set up               Lower Body Dressing: Maximal assistance;Sit to/from stand                 General ADL Comments: Pt insisting on sitting at EOB to eat his lunch, but demonstrating poor sitting balance with posterior lean and inability to right himself. Then agreed to get up to chair. RN in room at end of session, aware there was not a chair alarm under him.      Vision                     Perception     Praxis      Cognition   Behavior During Therapy: [facility] for tasks assessed/performed Overall Cognitive Status: No family/caregiver present to determine baseline cognitive functioning Area of Impairment: Problem solving;Safety/judgement          Safety/Judgement: Decreased awareness of safety;Decreased awareness of deficits   Problem Solving: Slow processing;Difficulty sequencing;Requires verbal cues      Extremity/Trunk Assessment               Exercises     Shoulder Instructions       General Comments      Pertinent Vitals/ Pain       Pain Assessment: No/denies pain  Home Living  Prior Functioning/Environment              Frequency Min 2X/week     Progress Toward Goals  OT Goals(current goals can now be found in the care plan section)  Progress towards OT goals: Progressing toward goals  Acute Rehab OT Goals Patient Stated Goal: go home and check on his wife  Plan Discharge plan remains appropriate    Co-evaluation                 End of Session Equipment Utilized During Treatment: Gait belt;Rolling walker   Activity Tolerance Patient limited by fatigue   Patient Left in chair;with call bell/phone within reach;with nursing/sitter in room   Nurse Communication   (educated pt in benefits of post acute rehab in SNF)        Time: 1152-1208 OT Time Calculation (min): 16 min  Charges: OT General Charges $OT Visit: 1 Procedure OT Treatments $Self Care/Home Management : 8-22 mins  Evern BioMayberry, Kainoah Bartosiewicz Lynn 02/24/2016, 12:52 PM  [phone]

## 2016-02-24 NOTE — NC FL2 (Signed)
[place] MEDICAID FL2 LEVEL OF CARE SCREENING TOOL     IDENTIFICATION  Patient Name: Jeremy LingoHelio Lamoureaux Birthdate: 03-01-1925 Sex: male Admission Date (Current Location): 02/21/2016  [facility] [place]   Facility and Address:  [facility], [address], [place], [place] [address]      Provider Number: [id number]  Attending Physician Name and Address:  Levert FeinsteinJames M Granfortuna, MD  Relative Name and Phone Number:  Lorina RabonDiva, spouse, [phone]    Current Level of Care: Hospital Recommended Level of Care: Skilled Nursing Facility Prior Approval Number:    Date Approved/Denied:   PASRR Number:    Discharge Plan: SNF    Current Diagnoses: Patient Active Problem List   Diagnosis Date Noted  . Secondary hyperparathyroidism of renal origin (HCC) 02/23/2016  . Pressure ulcer 02/22/2016  . Acute kidney injury (HCC)   . NSTEMI (non-ST elevated myocardial infarction) (HCC)   . Symptomatic anemia   . Vitamin D deficiency 06/12/2015  . Healthcare maintenance 02/16/2015  . Urinary incontinence 02/05/2014  . History of CVA (cerebrovascular accident) 01/01/2012  . Depression 11/11/2011    Class: Chronic  . ABNORMALITY OF GAIT 12/21/2010  . Hyperlipidemia 09/23/2010  . Venous insufficiency of both lower extremities 02/18/2008  . BPH (benign prostatic hyperplasia) 11/13/2006  . History of hyperthyroidism 09/30/2006  . Type 2 diabetes mellitus with mild nonproliferative diabetic retinopathy without macular edema (HCC) 09/30/2006  . Essential hypertension 09/30/2006  . Coronary atherosclerosis 09/30/2006  . Diverticulosis of large intestine 09/30/2006  . CHOLELITHIASIS 09/30/2006  . CKD (chronic kidney disease), stage IV (HCC) 09/30/2006  . CORONARY ARTERY BYPASS GRAFT, HX OF 09/30/2006    Orientation RESPIRATION BLADDER Height & Weight     Self, Time, Situation, Place  Normal Continent Weight: 92.625 kg (204 lb 3.2 oz) Height:  6' (182.9  cm)  BEHAVIORAL SYMPTOMS/MOOD NEUROLOGICAL BOWEL NUTRITION STATUS      Incontinent  (Please see DC summary)  AMBULATORY STATUS COMMUNICATION OF NEEDS Skin   Extensive Assist Verbally PU Stage and Appropriate Care (Stage II on sacrum and hip)                       Personal Care Assistance Level of Assistance  Bathing, Feeding, Dressing Bathing Assistance: Maximum assistance Feeding assistance: Limited assistance Dressing Assistance: Limited assistance     Functional Limitations Info             SPECIAL CARE FACTORS FREQUENCY  PT (By licensed PT), OT (By licensed OT)     PT Frequency: min 3x/week OT Frequency: min 2x/week            Contractures      Additional Factors Info  Code Status, Allergies, Insulin Sliding Scale Code Status Info: DNR Allergies Info: NKA   Insulin Sliding Scale Info: insulin aspart (novoLOG) injection 0-9 Units       Current Medications (02/24/2016):  This is the current hospital active medication list Current Facility-Administered Medications  Medication Dose Route Frequency Provider Last Rate Last Dose  . 0.9 %  sodium chloride infusion  250 mL Intravenous PRN Jeanella CrazeBrandi L Ollis, NP      . acetaminophen (TYLENOL) tablet 650 mg  650 mg Oral Q6H PRN Marjan Rabbani, MD      . amLODipine (NORVASC) tablet 5 mg  5 mg Oral Daily Jeanella CrazeBrandi L Ollis, NP   5 mg at 02/24/16 0954  . antiseptic oral rinse (CPC / CETYLPYRIDINIUM CHLORIDE 0.05%) solution  7 mL  7 mL Mouth Rinse BID Lupita Leash, MD   7 mL at 02/23/16 1118  . docusate sodium (COLACE) capsule 100 mg  100 mg Oral Daily John Giovanni, MD   100 mg at 02/24/16 0954  . feeding supplement (PRO-STAT SUGAR FREE 64) liquid 30 mL  30 mL Oral BID Levert Feinstein, MD   30 mL at 02/24/16 1238  . [START ON 02/25/2016] ferrous sulfate tablet 325 mg  325 mg Oral Q breakfast John Giovanni, MD      . insulin aspart (novoLOG) injection 0-9 Units  0-9 Units Subcutaneous TID WC Otis Brace, MD   1  Units at 02/24/16 1240  . metoprolol tartrate (LOPRESSOR) tablet 12.5 mg  12.5 mg Oral BID Jeanella Craze, NP   12.5 mg at 02/24/16 0954  . ondansetron (ZOFRAN) injection 4 mg  4 mg Intravenous Q6H PRN Jeanella Craze, NP      . pantoprazole (PROTONIX) EC tablet 40 mg  40 mg Oral Daily Marjan Rabbani, MD   40 mg at 02/24/16 0954  . simvastatin (ZOCOR) tablet 20 mg  20 mg Oral QHS Roslynn Amble, MD   20 mg at 02/23/16 2233  . tamsulosin (FLOMAX) capsule 0.4 mg  0.4 mg Oral Daily Roslynn Amble, MD   0.4 mg at 02/24/16 0953  . Vitamin D (Ergocalciferol) (DRISDOL) capsule 50,000 Units  50,000 Units Oral Q7 days Otis Brace, MD   50,000 Units at 02/24/16 1238     Discharge Medications: Please see discharge summary for a list of discharge medications.  Relevant Imaging Results:  Relevant Lab Results:   Additional Information SSN: [id number] [address] [place], [place]

## 2016-02-24 NOTE — Progress Notes (Signed)
Patient has an open sore that was oozing a very small amt of pus on the top left side of his scrotum. Found by tech, while cleaning patient. Appears to be approximately about an inch long. Will advise the morning RN. Patient not in any pain, call light within reach.

## 2016-02-24 NOTE — Progress Notes (Signed)
[facility]  [facility]  [facility].

## 2016-02-24 NOTE — Progress Notes (Signed)
Initial Nutrition Assessment  DOCUMENTATION CODES:   Not applicable  INTERVENTION:   Prostat po 30 ml BID with meals, each supplement provides 100 kcal, 15 grams protein  NUTRITION DIAGNOSIS:   Increased nutrient needs related to wound healing as evidenced by estimated needs  GOAL:   Patient will meet greater than or equal to 90% of their needs  MONITOR:   PO intake, Supplement acceptance, Labs, Weight trends, Skin, I & O's  REASON FOR ASSESSMENT:   Consult Assessment of nutrition requirement/status  ASSESSMENT:   82 y/o M, never smoker / ETOH, with PMH of DM, HTN, HLD, CAD s/p CABG, Venous Insufficiency, BPH, insomnia, diverticulosis, CKD (sr cr ~ 2.5 baseline), & hyperthyroidism who [facility] with complaints of weakness, chest pain and bright red blood per rectum.  Patient not responding to RD questions appropriately; RN at bedside. PO intake 100% this AM per flowsheet records. Increased nutrient needs given wound presence. RD to add liquid protein supplement.  Nutrition focused physical exam completed.  No muscle or subcutaneous fat depletion noticed.  Diet Order:  Diet Heart Room service appropriate?: Yes; Fluid consistency:: Thin  Skin:  Wound (see comment) (Stage II to L hip and sacrum)  Last BM:  3/28  Height:   Ht Readings from Last 1 Encounters:  02/22/16 6' (1.829 m)    Weight:   Wt Readings from Last 1 Encounters:  02/24/16 204 lb 3.2 oz (92.625 kg)    Ideal Body Weight:  81 kg  BMI:  Body mass index is 27.69 kg/(m^2).  Estimated Nutritional Needs:   Kcal:  1700-1900  Protein:  95-105 gm  Fluid:  1.7-1.9 L  EDUCATION NEEDS:   No education needs identified at this time  Maureen ChattersKatie Makensey Rego, RD, LDN Pager #: [phone] After-Hours Pager #: [phone]

## 2016-02-24 NOTE — Progress Notes (Signed)
Physical Therapy Treatment Patient Details Name: Jeremy Sanders MRN: [id number] DOB: 01-Mar-1925 Today's Date: 02/24/2016    History of Present Illness JeremyEnmanuel Sanders is a 82 y.o. pleasant man with past medical history of non-insulin dependent Type 2 DM, hypertension, hyperlipidemia, CKD Stage 4, secondary hyperparathyroidism, hyperthyroidism s/p RAI therapy in 2002, CAD s/p CABG in 1997, CVA, chronic venous insufficiency, BPH, diverticulosis, vitamin D deficiency, and GERD who presented on 3/26 with 2-week history of weakness and 1-3 day history of chest pain and BRBPR found to have symptomatic anemia due to probable diverticular bleed complicated by NSTEMI and AKI on CKD Stage 4.     PT Comments    Patient able to ambulate in hallway this session.  Seemed SOB, but walking pulse oximetry 99-100% on room air.  Feel patient remains at high risk for falls and still feel approprate for post acute rehab at [facility] setting.   Follow Up Recommendations  SNF     Equipment Recommendations  Rolling walker with 5" wheels (was delivered to room during session)    Recommendations for Other Services       Precautions / Restrictions Precautions Precautions: Fall  SpO2 99% HR 72 on RA   Mobility  Bed Mobility Overal bed mobility: Needs Assistance Bed Mobility: Supine to Sit     Supine to sit: Mod assist     General bed mobility comments: up in chair  Transfers Overall transfer level: Needs assistance Equipment used: Rolling walker (2 wheeled) Transfers: Sit to/from Stand Sit to Stand: Min assist;Mod assist Stand pivot transfers: Min assist       General transfer comment: up from low chair, cues for hand placement, initially more help than on repeated trials (stood for ambulation, then for walking to bathroom, toilet ,  audible crepitus in knees with transfers  Ambulation/Gait Ambulation/Gait assistance: Min assist Ambulation Distance (Feet): 90 Feet Assistive device: Rolling walker  (2 wheeled) Gait Pattern/deviations: Step-through pattern;Decreased stride length;Trendelenburg;Trunk flexed;Shuffle     General Gait Details: R hip weakness with ambulation and noted shuffling gait   Stairs            Wheelchair Mobility    Modified Rankin (Stroke Patients Only)       Balance Overall balance assessment: Needs assistance Sitting-balance support: Feet supported Sitting balance-Leahy Scale: Fair Sitting balance - Comments: sitting at edge of chair   Standing balance support: During functional activity;Single extremity supported Standing balance-Leahy Scale: Poor Standing balance comment: UE support needed on grabbar while performing toilet hygiene                    Cognition Arousal/Alertness: Awake/alert Behavior During Therapy: WFL for tasks assessed/performed Overall Cognitive Status: No family/caregiver present to determine baseline cognitive functioning Area of Impairment: Problem solving;Safety/judgement         Safety/Judgement: Decreased awareness of safety;Decreased awareness of deficits   Problem Solving: Slow processing;Requires verbal cues;Difficulty sequencing      Exercises      General Comments General comments (skin integrity, edema, etc.): LE edema, but pt refused recliner end of session      Pertinent Vitals/Pain Pain Assessment: No/denies pain    Home Living                      Prior Function            PT Goals (current goals can now be found in the care plan section) Acute Rehab PT Goals Patient Stated Goal: go home  and check on his wife Progress towards PT goals: Progressing toward goals    Frequency  Min 3X/week    PT Plan Current plan remains appropriate    Co-evaluation             End of Session Equipment Utilized During Treatment: Gait belt Activity Tolerance: Patient limited by fatigue;Patient limited by pain Patient left: in chair     Time: 1250-1317 PT Time  Calculation (min) (ACUTE ONLY): 27 min  Charges:  $Gait Training: 8-22 mins $Therapeutic Activity: 8-22 mins                    G Codes:      Elray McgregorCynthia Tobie Hellen 02/24/2016, 2:04 PM  Sheran Lawlessyndi Vada Swift, PT [phone] 02/24/2016

## 2016-02-24 NOTE — Care Management Note (Signed)
Case Management Note Previous CM note initiated by [facility] RN, CM  Patient Details  Name: Jeremy Sanders MRN: [id number] Date of Birth: [birth year]  Subjective/Objective:     Pt lives with 80 year-old wife, states he is the one who takes care of her as they have no relatives in this country.  Is very concerned about her well being and asks if someone could check on her.  CM discussed possibility of APS referral with CSW who will follow up.                            Action/Plan: Pt admitted with symptomatic anemia and Stemi, per PT eval recommendations for SNF- however CSW consulted and pt is refusing SNF- wants to go home to wife- [facility] for [facility]/OT- spoke with pt at bedside- choice offered- [facility]- referral [facility]an [facility]/OT- pt also states that his walker that he had with him in ambulance on arrival as been lost and no one has been able to locate it- order for new RW placed- [facility]- RW to be delivered to room prior to discharge- per pt he and his wife do not have any relatives here and he states that he has no one that can come provide transportation home- will speak with CSW regarding transportation needs- pt most likely will need ambulance transport home.   Expected Discharge Date:    02/24/16              Expected Discharge Plan:  Home w Home Health Services  In-House Referral:  Clinical Social Work  Discharge planning Services  CM Consult  Post Acute Care Choice:  Home Health, Durable Medical Equipment Choice offered to:     DME Arranged:  Walker rolling DME Agency:  [facility]  HH Arranged:  PT, OT HH Agency:  [facility]  Status of Service:  Completed, signed off  Medicare Important Message Given:    Date Medicare IM Given:    Medicare IM give by:    Date Additional Medicare IM Given:    Additional Medicare Important Message give by:     If discussed  at Long Length of Stay Meetings, dates discussed:    Discharge Disposition: skilled facility  Additional Comments:  02/23/16- 1230- Donn PieriniKristi James Lafalce RN, BSN- upon further eval by PT- pt agreeable to [facility] for rehab- CSW came to reassess- and pt wants to go to [facility] for short term. CSW will arrange for d/c to SNF today. Have notified [facility] of change in plans - HH will not be needed at this time.   Darrold SpanWebster, [name], RN 02/24/2016, 11:57 AM

## 2016-02-24 NOTE — Discharge Summary (Signed)
Name: Jeremy Sanders MRN: [id number] DOB: 07-28-1925 82 y.o. PCP: Jeremy BraceMarjan Rabbani, MD  Date of Admission: 02/21/2016  9:27 AM Date of Discharge: 02/24/2016 Attending Physician: Jeremy FeinsteinJames M Granfortuna, MD  Discharge Diagnosis:  Principal Problem:   Symptomatic anemia Active Problems:   Type 2 diabetes mellitus with mild nonproliferative diabetic retinopathy without macular edema (HCC)   Hyperlipidemia   Essential hypertension   Coronary atherosclerosis   Diverticulosis of large intestine   CKD (chronic kidney disease), stage IV (HCC)   BPH (benign prostatic hyperplasia)   Vitamin D deficiency   Acute kidney injury (HCC)   NSTEMI (non-ST elevated myocardial infarction) (HCC)   Pressure ulcer  Discharge Medications:   Medication List    STOP taking these medications        amLODipine 5 MG tablet  Commonly known as:  NORVASC     benazepril 20 MG tablet  Commonly known as:  LOTENSIN      TAKE these medications        acetaminophen 325 MG tablet  Commonly known as:  TYLENOL  Take 650 mg by mouth as needed for pain.     dipyridamole-aspirin 200-25 MG 12hr capsule  Commonly known as:  AGGRENOX  TAKE 1 CAPSULE BY MOUTH 2 (TWO) TIMES DAILY.     docusate sodium 100 MG capsule  Commonly known as:  COLACE  Take 1 capsule (100 mg total) by mouth daily.     ferrous sulfate 325 (65 FE) MG tablet  Take 1 tablet (325 mg total) by mouth daily with breakfast.  Start taking on:  02/25/2016     furosemide 40 MG tablet  Commonly known as:  LASIX  Take 2 tabs every day     glipiZIDE 5 MG tablet  Commonly known as:  GLUCOTROL  Take 0.5 tablets (2.5 mg total) by mouth daily before breakfast.     Medical Compression Socks Misc  Apply to both legs up to your knees bilaterally every day.     metoprolol tartrate 25 MG tablet  Commonly known as:  LOPRESSOR  TAKE 1/2 TABLET (12.5 MG) TWICE DAILY     nitroGLYCERIN 0.4 MG SL tablet  Commonly known as:  NITROSTAT  Place 0.4 mg under  the tongue every 5 (five) minutes as needed. For chest pain     omeprazole 20 MG capsule  Commonly known as:  PRILOSEC  TAKE 1 CAPSULE BY MOUTH EVERY OTHER DAY     simvastatin 20 MG tablet  Commonly known as:  ZOCOR  Take 1 tablet (20 mg total) by mouth at bedtime. .     tamsulosin 0.4 MG Caps capsule  Commonly known as:  FLOMAX  Take 1 capsule (0.4 mg total) by mouth daily.     Vitamin D (Ergocalciferol) 50000 units Caps capsule  Commonly known as:  DRISDOL  Take 1 capsule (50,000 Units total) by mouth every 7 (seven) days.        Disposition and follow-up:   Jeremy Sanders was discharged from [facility] in Stable condition.  At the hospital follow up visit please address:  1.  Symptomatic anemia -Avoid NSAIDs  HTN (BP noted to be soft) -We have discontinued home medication Benazepril in the setting of worsening renal function.  -Discontinued Amlodipine   CKD -Avoid nephrotoxins -Avoid NSAIDs  2.  Labs / imaging needed at time of follow-up: CBC, BMP  3.  Pending labs/ test needing follow-up:   Follow-up Appointments:     Follow-up Information  Follow up with Jeremy Kroner, MD. Go on 03/08/2016.   Specialty:  Internal Medicine   Why:  Appointment at 9:45 am.    Contact information:   [address] [place] [address] [phone]       Follow up with [facility].   Why:  HH-PT/OT arranged- they will call you to arrange visits   Contact information:   [address] [place] [address] [phone]       Follow up with [facility].   Why:  Rolling walker arranged- to be delivered to room prior to discharge   Contact information:   [address] [place] [address] [phone]       Discharge Instructions:   Consultations: Treatment Team:  Jeremy Rakes, MD  Procedures Performed:  Dg Chest 2 View  02/21/2016  CLINICAL DATA:  Possible myocardial infarction. EXAM:  CHEST  2 VIEW COMPARISON:  Into 313. FINDINGS: Cardiomegaly. Status post CABG. Linear markings of the lung bases could represent early interstitial edema. No consolidation. No significant pleural effusion. No visible pneumothorax. IMPRESSION: Worsening aeration from priors. Cardiomegaly. Early interstitial edema is possible. Electronically Signed   By: Jeremy Sanders M.D.   On: 02/21/2016 10:05   Dg Chest Port 1 View  02/22/2016  CLINICAL DATA:  Gastrointestinal bleeding, myocardial infarction, diabetes EXAM: PORTABLE CHEST 1 VIEW COMPARISON:  PA and lateral chest x-ray of February 21, 2016 FINDINGS: The lungs are well-expanded. The interstitial markings remain increased bilaterally. The cardiac silhouette remains enlarged. The pulmonary vascularity is mildly engorged but less prominent than on yesterday's study. There are post CABG changes. There is no significant pleural effusion and there is no pneumothorax. The trachea is midline. The bony thorax exhibits no acute abnormality. IMPRESSION: CHF superimposed upon COPD. There has been slight interval improvement since yesterday's study. Electronically Signed   By: Jeremy Sanders M.D.   On: 02/22/2016 07:26    Admission HPI: 82 y/o M, never smoker / ETOH, with PMH of DM, HTN, HLD, CAD s/p CABG, Venous Insufficiency, BPH, insomnia, diverticulosis, CKD (sr cr ~ 2.5 baseline), & hyperthyroidism who presented to [facility] on 3/26 via EMS with complaints of weakness, chest pain and bright red blood per rectum.   At baseline, the patient lives with his 33 y/o wife alone. They do not have family support. He walks with a walker at home. The patient is technically 22 years old - his father listed him as 4 years younger on documented paperwork to avoid being involved in WWII. He was born [place] and speaks 5 languages. He typically helps take care of his wife and is concerned about her being alone without him.   The patient reports he has had weakness for two  weeks. 2-3 days prior to admit, he began having bright red blood with bowel movements. He also noted chest pressure that was worse with lying down. He reported it began on 3/26 and by the time EMS arrived, the chest pain had resolved. EKG notable for new ST depression per EMS. He was treated with baby ASA x4. He arrived to the ER in a shirt that was covered in old blood (reportedly, he had been wearing for two days). He typically wears TED hose for edema and notes that it is no worse than usual. TED hose removed in ER due to swelling and concern for skin breakdown.   Initial ER evaluation notable for mild work of breathing per notes, LE  swelling and stage 1 sacral decubitus. No further bleeding noted in ER. EDP reports rectal exam was brown stool but heme positive. No bleeding noted on rectal exam. Labs - Na 142, K 4.5, sr cr 4.10 (up from approx baseline of 2.5), AG 10, lactic acid 1.77, BNP 1537, troponin >35, WBC 9.4, Hgb 6.8 and platelets 178. Initial CXR with mild interstitial edema, no effusions or overt CHF.   PCCM consulted for evaluation / admission.   Hospital Course by problem list: Principal Problem:   Symptomatic anemia Active Problems:   Type 2 diabetes mellitus with mild nonproliferative diabetic retinopathy without macular edema (HCC)   Hyperlipidemia   Essential hypertension   Coronary atherosclerosis   Diverticulosis of large intestine   CKD (chronic kidney disease), stage IV (HCC)   BPH (benign prostatic hyperplasia)   Vitamin D deficiency   Acute kidney injury (HCC)   NSTEMI (non-ST elevated myocardial infarction) (HCC)   Pressure ulcer   Symptomatic anemia  Patient presented on 3/26 with 2-week history of weakness and 1-3 day history of chest pain and BRBPR found to have symptomatic anemia due to probable diverticular bleed complicated by NSTEMI and AKI on CKD Stage 4. He is on aggrenox and takes naproxen daily at home. On admission he was hemodynamically  stable with BP 129/85. His hemoglobin on admission was 6.8 (baseline 13), Cr was 4.10 (baseline 2.5), and troponin was 32.5 FOBT was positive.12-lead EKG revealed ST depressions in the lateral and inferior leads, Q waves in anterior lead, and PAC's. Chest xray revealed cardiomegaly with possible interstitial edema. He was admitted to ICU for close monitoring. He received 2 units of pRBC with Hg improved (8.0 on the day of discharge). His chest pain resolved in the ED and troponin peaked at 75.41. His BNP was elevated at 1537.2 and 2D-echo revealed EF of 20% with diffuse hypokinesis (last EF 50% in 2013). He was not started on IV heparin due to acute GI bleed. He was seen by GI and cardiology who recommended supportive care with no invasive procedures due to age and comorbidities. When patient was transferred from the ICU to our service, he was feeling well. He denied having any chest pain, fatigue, lightheadedness, dyspnea, nausea, vomiting, abdominal pain, melena, or BRBPR. He was tolerating PO intake well. He was transfused given a Ferraheme transfusion and started on an oral iron supplement.   Discharge Vitals:   BP 98/52 mmHg  Pulse 63  Temp(Src) 97.6 F (36.4 C) (Oral)  Resp 18  Ht 6' (1.829 m)  Wt 204 lb 3.2 oz (92.625 kg)  BMI 27.69 kg/m2  SpO2 96%  Discharge Labs:  Results for orders placed or performed during the hospital encounter of 02/21/16 (from the past 24 hour(s))  Glucose, capillary     Status: Abnormal   Collection Time: 02/23/16  4:34 PM  Result Value Ref Range   Glucose-Capillary 143 (H) 65 - 99 mg/dL  Glucose, capillary     Status: Abnormal   Collection Time: 02/23/16  8:42 PM  Result Value Ref Range   Glucose-Capillary 130 (H) 65 - 99 mg/dL  Basic metabolic panel     Status: Abnormal   Collection Time: 02/24/16  3:10 AM  Result Value Ref Range   Sodium 141 135 - 145 mmol/L   Potassium 4.8 3.5 - 5.1 mmol/L   Chloride 114 (H) 101 - 111 mmol/L   CO2 18 (L) 22 - 32  mmol/L   Glucose, Bld 177 (H) 65 - 99 mg/dL  BUN 61 (H) 6 - 20 mg/dL   Creatinine, Ser 1.61 (H) 0.61 - 1.24 mg/dL   Calcium 7.6 (L) 8.9 - 10.3 mg/dL   GFR calc non Af Amer 10 (L) >60 mL/min   GFR calc Af Amer 12 (L) >60 mL/min   Anion gap 9 5 - 15  Prealbumin     Status: Abnormal   Collection Time: 02/24/16  3:10 AM  Result Value Ref Range   Prealbumin 10.6 (L) 18 - 38 mg/dL  CBC with Differential/Platelet     Status: Abnormal   Collection Time: 02/24/16  3:10 AM  Result Value Ref Range   WBC 9.6 4.0 - 10.5 K/uL   RBC 2.66 (L) 4.22 - 5.81 MIL/uL   Hemoglobin 8.0 (L) 13.0 - 17.0 g/dL   HCT 09.6 (L) 04.5 - 40.9 %   MCV 96.2 78.0 - 100.0 fL   MCH 30.1 26.0 - 34.0 pg   MCHC 31.3 30.0 - 36.0 g/dL   RDW 81.1 (H) 91.4 - 78.2 %   Platelets 169 150 - 400 K/uL   Neutrophils Relative % 70 %   Neutro Abs 6.7 1.7 - 7.7 K/uL   Lymphocytes Relative 16 %   Lymphs Abs 1.6 0.7 - 4.0 K/uL   Monocytes Relative 10 %   Monocytes Absolute 0.9 0.1 - 1.0 K/uL   Eosinophils Relative 4 %   Eosinophils Absolute 0.3 0.0 - 0.7 K/uL   Basophils Relative 0 %   Basophils Absolute 0.0 0.0 - 0.1 K/uL  Glucose, capillary     Status: Abnormal   Collection Time: 02/24/16  4:05 AM  Result Value Ref Range   Glucose-Capillary 145 (H) 65 - 99 mg/dL  Glucose, capillary     Status: Abnormal   Collection Time: 02/24/16  7:47 AM  Result Value Ref Range   Glucose-Capillary 148 (H) 65 - 99 mg/dL  Glucose, capillary     Status: Abnormal   Collection Time: 02/24/16 12:11 PM  Result Value Ref Range   Glucose-Capillary 129 (H) 65 - 99 mg/dL    Signed: John Giovanni, MD 02/24/2016, 3:28 PM    Services Ordered on Discharge:  Equipment Ordered on Discharge:

## 2016-02-24 NOTE — Discharge Instructions (Signed)

## 2016-02-25 NOTE — Clinical Social Work Placement (Signed)
   CLINICAL SOCIAL WORK PLACEMENT  NOTE  Date:  02/25/2016  Patient Details  Name: Jeremy Sanders MRN: [id number] Date of Birth: 1925/09/26  Clinical Social Work is seeking post-discharge placement for this patient at the Skilled  Nursing Facility level of care (*CSW will initial, date and re-position this form in  chart as items are completed):  Yes   Patient/family provided with [facility] Clinical Social Work Department's list of facilities offering this level of care within the geographic area requested by the patient (or if unable, by the patient's family).  Yes   Patient/family informed of their freedom to choose among providers that offer the needed level of care, that participate in Medicare, Medicaid or managed care program needed by the patient, have an available bed and are willing to accept the patient.  Yes   Patient/family informed of [facility]'s ownership interest in [facility] and [facility], as well as of the fact that they are under no obligation to receive care at these facilities.  PASRR submitted to EDS on 02/24/16     PASRR number received on 02/24/16     Existing PASRR number confirmed on       FL2 transmitted to all facilities in geographic area requested by pt/family on 02/24/16     FL2 transmitted to all facilities within larger geographic area on       Patient informed that his/her managed care company has contracts with or will negotiate with certain facilities, including the following:        Yes   Patient/family informed of bed offers received.  [facility]     Physician recommends and patient chooses bed at      [facility] 02[facility].  Patient to be transferred to facility by [facility]     Patient family notified on 02/25/16 of transfer.  Name of family member notified:  Wife     PHYSICIAN       Additional Comment:     _______________________________________________ Mearl LatinNadia S Hassan Blackshire, LCSWA 02/25/2016, 7:42 AM

## 2016-02-25 NOTE — Progress Notes (Signed)
Patient now requesting SNF so that he can get stronger to get back home to his wife. [facility] is very close to his home. CSW to send referral.  Loyal Bubaadia Krista Godsil LCSWA [phone]

## 2016-03-08 ENCOUNTER — Ambulatory Visit: Payer: Medicare Other | Admitting: Internal Medicine

## 2016-03-18 ENCOUNTER — Encounter: Payer: Medicare Other | Admitting: Internal Medicine

## 2016-03-28 DEATH — deceased

## 2016-04-08 ENCOUNTER — Encounter: Payer: Medicare Other | Admitting: Internal Medicine

## 2017-10-10 IMAGING — CR DG CHEST 1V PORT
1 series · 1 of 1 positions shown · non-contrast
Comparison: PA and lateral chest x-ray February 21, 2016

CLINICAL DATA: Gastrointestinal bleeding, myocardial infarction,
diabetes

EXAM:
PORTABLE CHEST 1 VIEW

[AP]
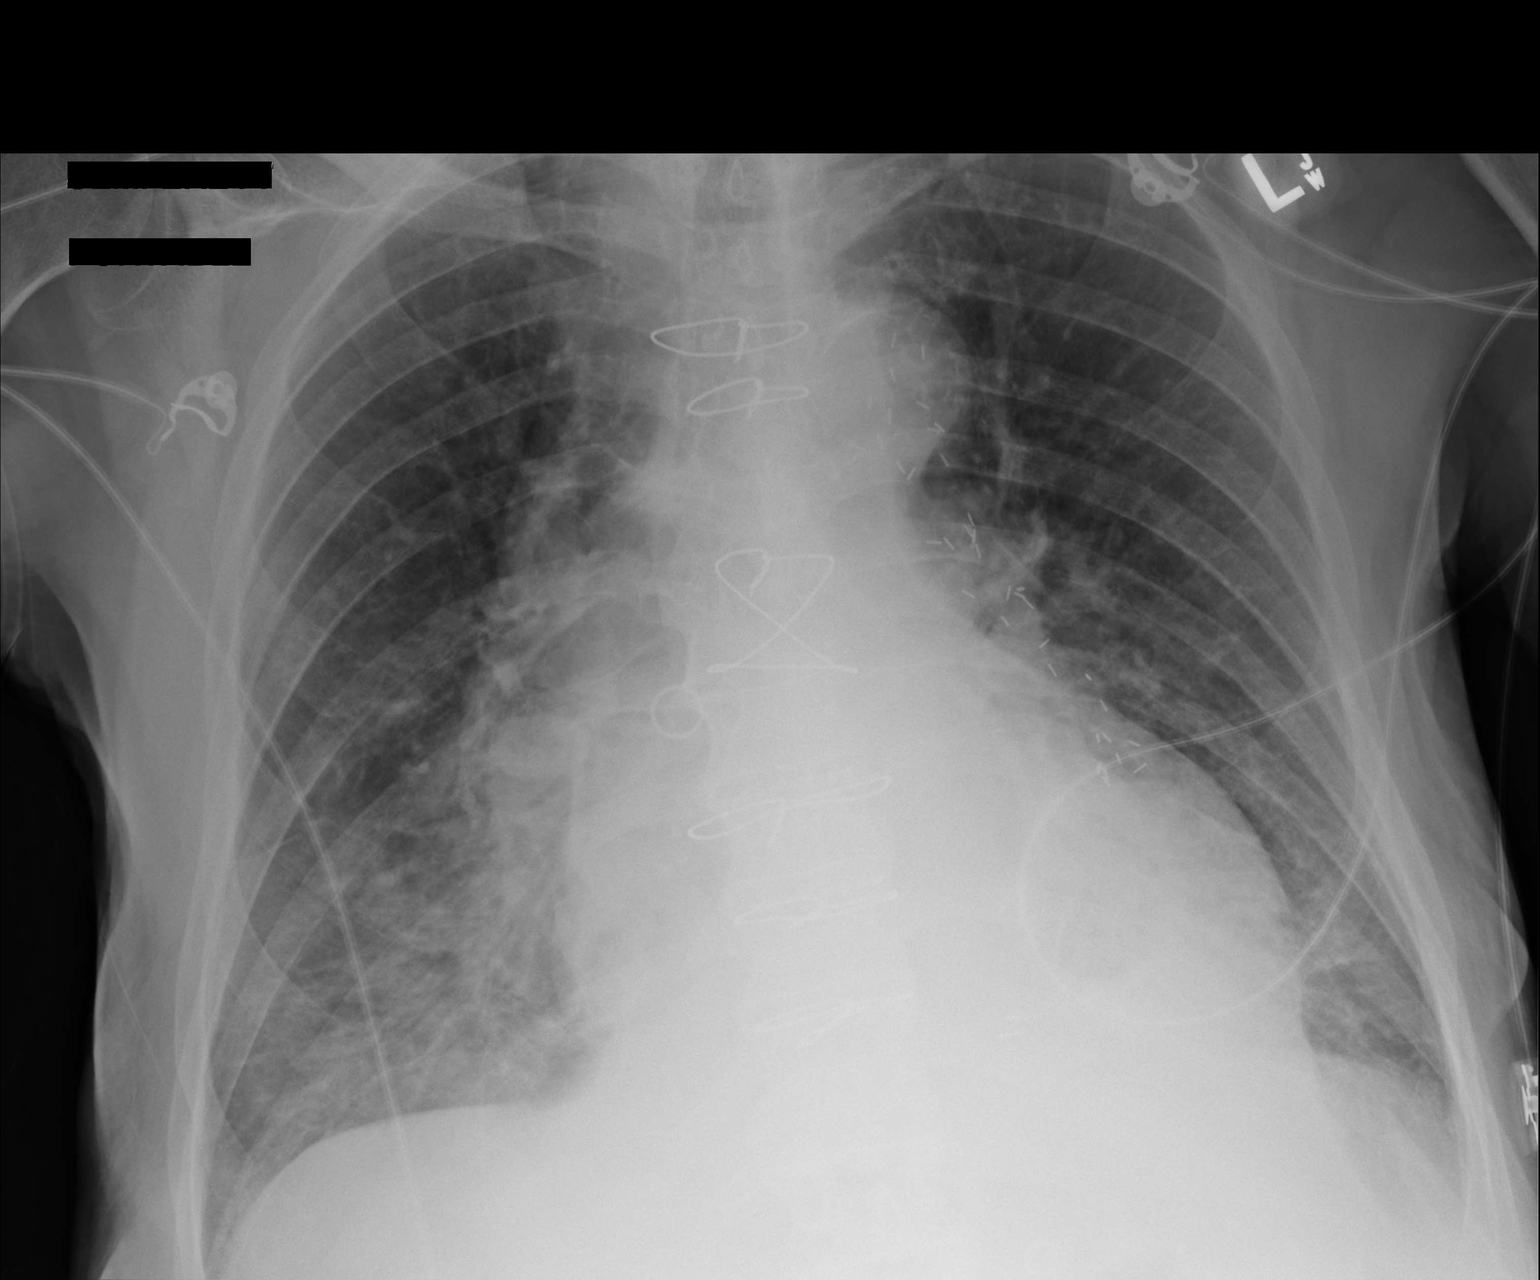

[1 of 1 positions shown; findings below may reference images not displayed]

FINDINGS: The lungs are well-expanded. The interstitial markings remain
increased bilaterally. The cardiac silhouette remains enlarged. The
pulmonary vascularity is mildly engorged but less prominent than on
yesterday's study. There are post CABG changes. There is no
significant pleural effusion and there is no pneumothorax. The
trachea is midline. The bony thorax exhibits no acute abnormality.
IMPRESSION: CHF superimposed upon COPD. There has been slight interval
improvement since yesterday's study.
